# Patient Record
Sex: Male | Born: 1965 | Race: Black or African American | Hispanic: No | Marital: Single | Smoking: Current every day smoker
Health system: Southern US, Community
[De-identification: ages and names within clinical notes are randomized; demographics above are authoritative.]

## PROBLEM LIST (undated history)

## (undated) DIAGNOSIS — J40 Bronchitis, not specified as acute or chronic: Secondary | ICD-10-CM

## (undated) HISTORY — PX: EXTERNAL EAR SURGERY: SHX627

---

## 2014-10-16 ENCOUNTER — Emergency Department
Admission: EM | Admit: 2014-10-16 | Discharge: 2014-10-16 | Disposition: A | Discharge status: Home / Self Care | Payer: Self-pay | Attending: Student | Admitting: Student

## 2014-10-16 ENCOUNTER — Emergency Department: Payer: Self-pay

## 2014-10-16 ENCOUNTER — Encounter: Payer: Self-pay | Admitting: Emergency Medicine

## 2014-10-16 DIAGNOSIS — R079 Chest pain, unspecified: Secondary | ICD-10-CM

## 2014-10-16 DIAGNOSIS — Z720 Tobacco use: Secondary | ICD-10-CM | POA: Insufficient documentation

## 2014-10-16 DIAGNOSIS — M25511 Pain in right shoulder: Secondary | ICD-10-CM | POA: Insufficient documentation

## 2014-10-16 DIAGNOSIS — I872 Venous insufficiency (chronic) (peripheral): Secondary | ICD-10-CM | POA: Insufficient documentation

## 2014-10-16 DIAGNOSIS — Z86718 Personal history of other venous thrombosis and embolism: Secondary | ICD-10-CM | POA: Insufficient documentation

## 2014-10-16 DIAGNOSIS — M25512 Pain in left shoulder: Secondary | ICD-10-CM

## 2014-10-16 DIAGNOSIS — G8929 Other chronic pain: Secondary | ICD-10-CM | POA: Insufficient documentation

## 2014-10-16 LAB — TROPONIN I: Troponin I: <0.03 ng/mL (ref <0.031)

## 2014-10-16 LAB — CBC
HCT: 41.3 % (ref 40.0–52.0)
HEMOGLOBIN: 14.2 g/dL (ref 13.0–18.0)
MCH: 31 pg (ref 26.0–34.0)
MCHC: 34.4 g/dL (ref 32.0–36.0)
MCV: 90.2 fL (ref 80.0–100.0)
Platelets: 240 10*3/uL (ref 150–440)
RBC: 4.58 MIL/uL (ref 4.40–5.90)
RDW: 15 % — ABNORMAL HIGH (ref 11.5–14.5)
WBC: 6.8 10*3/uL (ref 3.8–10.6)

## 2014-10-16 LAB — BASIC METABOLIC PANEL
Anion gap: 9 (ref 5–15)
BUN: 18 mg/dL (ref 6–20)
CHLORIDE: 108 mmol/L (ref 101–111)
CO2: 24 mmol/L (ref 22–32)
Calcium: 8.8 mg/dL — ABNORMAL LOW (ref 8.9–10.3)
Creatinine, Ser: 1.14 mg/dL (ref 0.61–1.24)
GLUCOSE: 128 mg/dL — AB (ref 65–99)
Potassium: 3.6 mmol/L (ref 3.5–5.1)
SODIUM: 141 mmol/L (ref 135–145)

## 2014-10-16 LAB — FIBRIN DERIVATIVES D-DIMER (ARMC ONLY): FIBRIN DERIVATIVES D-DIMER (ARMC): 396 (ref 0–499)

## 2014-10-16 MED ORDER — IBUPROFEN 600 MG PO TABS
600.0000 mg | ORAL_TABLET | Freq: Once | ORAL | Status: AC
  Administered 2014-10-16: 600 mg via ORAL

## 2014-10-16 MED ORDER — IBUPROFEN 600 MG PO TABS
ORAL_TABLET | ORAL | Status: AC
  Administered 2014-10-16: 600 mg via ORAL
  Filled 2014-10-16: qty 1

## 2014-10-16 MED ORDER — IBUPROFEN 600 MG PO TABS: 600.0000 mg | ORAL_TABLET | Freq: Four times a day (QID) | ORAL | Status: DC | PRN

## 2014-10-16 NOTE — Discharge Instructions (Signed)
Return immediately to the emergency department if you develop severe/worsening/recurrent chest pain, if the pain changes in quality, if you have trouble breathing, if you have fainting or near fainting, fevers, recurrent vomiting, abdominal pain, numbness or weakness, or for any other concerns.
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
## 2014-10-16 NOTE — Care Management Note (Signed)
Case Management Note  Patient Details  Name: Curtis Chapman MRN: [id number] Date of Birth: 10/14/1965  Subjective/Objective:     SPoke to pt. Per Dr Inocencio HomesGayle. Pt. States [facility] is [facility], when I suggested he apply. He has a [place] address on his ID , and says [place] actually [place] in [place]. I encouraged him to get a current address ID. I also told him about [facility], since he also states he has no transport to get to his MD appts.               Action/Plan:   Expected Discharge Date:                  Expected Discharge Plan:     In-House Referral:     Discharge planning Services     Post Acute Care Choice:    Choice offered to:     DME Arranged:    DME Agency:     HH Arranged:    HH Agency:     Status of Service:     Medicare Important Message Given:    Date Medicare IM Given:    Medicare IM give by:    Date Additional Medicare IM Given:    Additional Medicare Important Message give by:     If discussed at Long Length of Stay Meetings, dates discussed:    Additional Comments:  Berna BueCheryl Brinlyn Cena, RN 10/16/2014, 9:25 AM
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
## 2014-10-16 NOTE — ED Notes (Signed)
esignature page not working, unable to obtain signature. Patient dcd with understanding and knowledge of instructions provided. Patient given prescriptions and dc packet.

## 2014-10-21 ENCOUNTER — Emergency Department
Admission: EM | Admit: 2014-10-21 | Discharge: 2014-10-21 | Disposition: A | Discharge status: Home / Self Care | Payer: Self-pay | Attending: Emergency Medicine | Admitting: Emergency Medicine

## 2014-10-21 ENCOUNTER — Encounter: Payer: Self-pay | Admitting: Emergency Medicine

## 2014-10-21 DIAGNOSIS — Z720 Tobacco use: Secondary | ICD-10-CM | POA: Insufficient documentation

## 2014-10-21 DIAGNOSIS — I872 Venous insufficiency (chronic) (peripheral): Secondary | ICD-10-CM

## 2014-10-21 DIAGNOSIS — I8312 Varicose veins of left lower extremity with inflammation: Secondary | ICD-10-CM | POA: Insufficient documentation

## 2014-10-21 DIAGNOSIS — I8311 Varicose veins of right lower extremity with inflammation: Secondary | ICD-10-CM | POA: Insufficient documentation

## 2014-10-21 MED ORDER — HYDROCORTISONE 1 % EX CREA
TOPICAL_CREAM | CUTANEOUS | Status: AC
Start: 2014-10-21 — End: 2015-10-21

## 2014-10-21 NOTE — Care Management Note (Signed)
Case Management Note  Patient Details  Name: Annye RuskDerrick Fambro MRN: [id number] Date of Birth: 1966-04-21  Subjective/Objective:   Pt. Here with itching in his legs. Given script by Dr Cyril LoosenKinner. After our talk last week, the pt. Has obtained a ID card with [place] on it. I have given the pt. The application form for [facility] across the street, as well as directions. He has his scooter , and willing to go there to get the medicine.                 Action/Plan:   Expected Discharge Date:                  Expected Discharge Plan:     In-House Referral:     Discharge planning Services     Post Acute Care Choice:    Choice offered to:     DME Arranged:    DME Agency:     HH Arranged:    HH Agency:     Status of Service:     Medicare Important Message Given:    Date Medicare IM Given:    Medicare IM give by:    Date Additional Medicare IM Given:    Additional Medicare Important Message give by:     If discussed at Long Length of Stay Meetings, dates discussed:    Additional Comments:  Berna BueCheryl Braden Deloach, RN 10/21/2014, 9:34 AM

## 2014-10-21 NOTE — Discharge Instructions (Signed)
Stasis Dermatitis Stasis dermatitis occurs when veins lose the ability to pump blood back to the heart (poor venous circulation). It causes a reddish-purple to brownish scaly, itchy rash on the legs. The rash comes from pooling of blood (stasis). CAUSES  This occurs because the veins do not work very well anymore or because pressure may be increased in the veins due to other conditions. With blood pooling, the increased pressure in the tiny blood vessels (capillaries) causes fluid to leak out of the capillaries into the tissue. The extra fluid makes it harder for the blood to feed the cells and get rid of waste products. SYMPTOMS  Stasis dermatitis appears as red, scaly, itchy patches on the legs. A yellowish or light brown discoloration is also present. Due to scratching or other injury, these patches can become an ulcer. This ulcer may remain for long periods of time. The ulcer can also become infected. Swelling of the legs is often present with stasis dermatitis. If the leg is swollen, this increases the risk of infection and further damage to the skin. Sometimes, intense itching, tingling, and burning occurs before signs of stasis dermatitis appear. You may find yourself scratching the insides of your ankles or rubbing your ankles together before the rash appears. After healing, there are often brown spots on the affected skin. DIAGNOSIS  Your caregiver makes this diagnosis based on an exam. Other tests may be done to better understand the cause. TREATMENT  If underlying conditions are present, they must be treated. Some of these conditions are heart failure, thyroid problems, poor nutrition, and varicose veins.  Cortisone creams and ointments applied to the skin (topically) may be needed, as well as medicine to reduce swelling in the legs (diuretics).  Compression stockings or an elastic wrap may also be needed to reduce swelling.  If there is an infection, antibiotic medicines may also be  used. HOME CARE INSTRUCTIONS   Try to rest and raise (elevate) the affected leg above the level of the heart, if possible.  Burow's solution wet packs applied for 30 minutes, 3 times daily, will help the weepy rash. Stop using the packs before your skin gets too dry. You can also use a mixture of 3 parts white vinegar to 1 quart water.  Grease your legs daily with ointments, such as petroleum jelly, to fight dryness.  Avoid scratching or injuring the area. SEEK IMMEDIATE MEDICAL CARE IF:   Your rash gets worse.  An ulcer forms.  You have an oral temperature above 102 F (38.9 C), not controlled by medicine.  You have any other severe symptoms. Document Released: 08/12/2005 Document Revised: 07/26/2011 Document Reviewed: 09/22/2009 [employer] Patient Information 2015 [employer]. This information is not intended to replace advice given to you by your health care provider. Make sure you discuss any questions you have with your health care provider.  

## 2014-10-21 NOTE — ED Notes (Signed)
x4 months

## 2014-10-21 NOTE — ED Provider Notes (Addendum)
[facility] Emergency Department Provider Note  ____________________________________________  Time seen: 9:05 AM  I have reviewed the triage vital signs and the nursing notes.   HISTORY  Chief Complaint Leg Pain    HPI Curtis Chapman is a 49 y.o. male who presents with complaints of itching in both legs which has gotten worse over the last few days. He has a history of chronic venous stasis of the legs and he is complaining of skin irritation and pruritus. He has no calf pain. No shortness of breath or pleurisy. He complains of skin color changes that has been gradual over the last 6 months in both legs. He requests creams to help him with the itching     History reviewed. No pertinent past medical history.  There are no active problems to display for this patient.   No past surgical history on file.  Current Outpatient Rx  Name  Route  Sig  Dispense  Refill  . ibuprofen (ADVIL,MOTRIN) 600 MG tablet   Oral   Take 1 tablet (600 mg total) by mouth every 6 (six) hours as needed for moderate pain.   15 tablet   0     Allergies Review of patient's allergies indicates no known allergies.  No family history on file.  Social History History  Substance Use Topics  . Smoking status: Current Every Day Smoker  . Smokeless tobacco: Not on file  . Alcohol Use: No    Review of Systems  Constitutional: Negative for fever. Eyes: Negative for visual changes. ENT: Negative for sore throat Cardiovascular: Negative for chest pain. Respiratory: Negative for shortness of breath. Gastrointestinal: Negative for abdominal pain, vomiting and diarrhea. Genitourinary: Negative for dysuria. Musculoskeletal: Negative for back pain. Skin: Positive for skin discoloration the lower legs    10-point ROS otherwise negative.  ____________________________________________   PHYSICAL EXAM:  VITAL SIGNS: ED Triage Vitals  Enc Vitals Group     BP 10/21/14 0830  113/68 mmHg     Pulse Rate 10/21/14 0830 82     Resp 10/21/14 0830 20     Temp 10/21/14 0830 98 F (36.7 C)     Temp Source 10/21/14 0830 Oral     SpO2 10/21/14 0830 96 %     Weight 10/21/14 0830 220 lb (99.791 kg)     Height 10/21/14 0830 5\' 6"  (1.676 m)     Head Cir --      Peak Flow --      Pain Score 10/21/14 0831 9     Pain Loc --      Pain Edu? --      Excl. in GC? --      Constitutional: Alert and oriented. Well appearing and in no distress. Eyes: Conjunctivae are normal. PERRL. ENT   Head: Normocephalic and atraumatic.   Nose: No rhinnorhea.   Mouth/Throat: Mucous membranes are moist. Cardiovascular: Normal rate, regular rhythm. Normal and symmetric distal pulses are present in all extremities. No murmurs, rubs, or gallops. Respiratory: Normal respiratory effort without tachypnea nor retractions. Breath sounds are clear and equal bilaterally.  Gastrointestinal: Soft and non-tender in all quadrants. No distention. There is no CVA tenderness. Genitourinary: deferred Musculoskeletal: Nontender with normal range of motion in all extremities. No lower extremity tenderness nor edema. Neurologic:  Normal speech and language. No gross focal neurologic deficits are appreciated. Skin:  Patient with reddish-brown discoloration along the medial aspect of the right leg and the left leg consistent with venous stasis. No ulceration  at this time. No calf tenderness to palpation Psychiatric: Mood and affect are normal. Patient exhibits appropriate insight and judgment.  ____________________________________________    LABS (pertinent positives/negatives)  Labs Reviewed - No data to display  ____________________________________________   EKG  None  ____________________________________________    RADIOLOGY  None  ____________________________________________   PROCEDURES  Procedure(s) performed: none  Critical Care performed:  none  ____________________________________________   INITIAL IMPRESSION / ASSESSMENT AND PLAN / ED COURSE  Pertinent labs & imaging results that were available during my care of the patient were reviewed by me and considered in my medical decision making (see chart for details).  Patient with chronic venous stasis. I will prescribe steroid cream for the dermatitis and provided education on conservative management with venous stasis.  I counseled the patient on smoking cessation for approximately 4 minutes.  ____________________________________________   FINAL CLINICAL IMPRESSION(S) / ED DIAGNOSES  Final diagnoses:  Chronic venous stasis dermatitis of both lower extremities     Jene Every, MD 10/21/14 [id number]  Jene Every, MD 10/21/14 [phone]

## 2014-11-09 ENCOUNTER — Encounter: Payer: Self-pay | Admitting: Emergency Medicine

## 2014-11-09 ENCOUNTER — Emergency Department: Payer: Self-pay

## 2014-11-09 ENCOUNTER — Emergency Department
Admission: EM | Admit: 2014-11-09 | Discharge: 2014-11-09 | Disposition: A | Discharge status: Home / Self Care | Payer: Self-pay | Attending: Emergency Medicine | Admitting: Emergency Medicine

## 2014-11-09 DIAGNOSIS — Z7952 Long term (current) use of systemic steroids: Secondary | ICD-10-CM | POA: Insufficient documentation

## 2014-11-09 DIAGNOSIS — I872 Venous insufficiency (chronic) (peripheral): Secondary | ICD-10-CM | POA: Insufficient documentation

## 2014-11-09 DIAGNOSIS — Z720 Tobacco use: Secondary | ICD-10-CM | POA: Insufficient documentation

## 2014-11-09 MED ORDER — SULFAMETHOXAZOLE-TRIMETHOPRIM 400-80 MG PO TABS: 1.0000 | ORAL_TABLET | Freq: Two times a day (BID) | ORAL | Status: DC

## 2014-11-09 NOTE — Discharge Instructions (Signed)
Continue home topical medication as needed for itching. AVOID scratching. Elevate legs. Use compression stockings daily. Take medication as prescribed. Keep legs clean.   Follow up your primary care physician or the above this week. Close follow up is important.   Return to ER for increased pain, swelling, redness, break in skin, new or worsening concerns.   Compression Stockings Compression stockings are elastic stockings that "compress" your legs. This helps to increase blood flow, decrease swelling, and reduces the chance of getting blood clots in your lower legs. Compression stockings are used:  After surgery.  If you have a history of poor circulation.  If you are prone to blood clots.  If you have varicose veins.  If you sit or are bedridden for long periods of time. WEARING COMPRESSION STOCKINGS  Your compression stockings should be worn as instructed by your caregiver.  Wearing the correct stocking size is important. Your caregiver can help measure and fit you to the correct size.  When wearing your stockings, do not allow the stockings to bunch up. This is especially important around your toes or behind your knees. Keep the stockings as smooth as possible.  Do not roll the stockings downward and leave them rolled down. This can form a restrictive band around your legs and can decrease blood flow.  The stockings should be removed once a day for 1 hour or as instructed by your caregiver. When the stockings are taken off, inspect your legs and feet. Look for:  Open sores.  Red spots.  Puffy areas (swelling).  Anything that does not seem normal. IMPORTANT INFORMATION ABOUT COMPRESSION STOCKINGS  The compression stockings should be clean, dry, and in good condition before you put them on.  Do not put lotion on your legs or feet. This makes it harder to put the stockings on.  Change your stockings immediately if they become wet or soiled.  Do not wear stockings that  are ripped or torn.  You may hand-wash or put your stockings in the washing machine. Use cold or warm water with mild detergent. Do not bleach your stockings. They may be air-dried or dried in the dryer on low heat.  If you have pain or have a feeling of "pins and needles" in your feet or legs, you may be wearing stockings that are too tight. Call your caregiver right away. SEEK IMMEDIATE MEDICAL CARE IF:   You have numbness or tingling in your lower legs that does not get better quickly after the stockings are removed.  Your toes or feet become cold and blue.  You develop open sores or have red spots on your legs that do not go away. MAKE SURE YOU:   Understand these instructions.  Will watch your condition.  Will get help right away if you are not doing well or get worse. Document Released: 02/28/2009 Document Revised: 07/26/2011 Document Reviewed: 02/28/2009 [facility] Patient Information 2015 [place], [place]. This information is not intended to replace advice given to you by your health care provider. Make sure you discuss any questions you have with your health care provider.  Stasis Dermatitis Stasis dermatitis occurs when veins lose the ability to pump blood back to the heart (poor venous circulation). Dry skin commonly occurs along with poor venous circulation. Stasis dermatitis appears as red, scaly, itchy patches on the legs. A yellowish or light brown discoloration is also present. Due to scratching or other injury, these patches can become an ulcer. This ulcer may remain for long periods of  time. The ulcer can also become infected. Swelling of the legs is often present with stasis dermatitis. If the leg is swollen, this increases the risk of infection and further damage to the skin. Sometimes, intense itching, tingling, and burning occurs before signs of stasis dermatitis appear. You may find yourself scratching the insides of your ankles or rubbing your ankles together before the  rash appears. After healing, there are often brown spots on the affected skin. Treatment includes resting and elevating the affected leg above the level of the heart, if possible. Cortisone creams and ointments applied to the skin (topically) may be needed, as well as medicine to reduce swelling in the legs (diuretics). Compression stockings or an elastic wrap may also be needed to reduce swelling. If there is an infection, antibiotic medicines may also be used. Your skin may react to topical medicine (sensitization). Your skin may react to ingredients in wool wax alcohols, fragrances, and even topical corticosteroids. Burow's solution wet packs applied for 30 minutes, 3 times daily, will help the weepy rash. Stop using the packs before your skin gets too dry. You can also use a mixture of 3 parts white vinegar to 1 quart water. Grease your legs daily with ointments, such as petroleum jelly, to fight dryness. Avoid scratching or injuring the affected area. Call your caregiver right away if your rash gets worse, if an ulcer forms, or if you have a fever or other severe symptoms. Document Released: 06/10/2004 Document Revised: 07/26/2011 Document Reviewed: 09/22/2009 [facility] Patient Information 2015 [place], [place]. This information is not intended to replace advice given to you by your health care provider. Make sure you discuss any questions you have with your health care provider.

## 2014-11-09 NOTE — ED Provider Notes (Signed)
[facility] Emergency Department Provider Note  ____________________________________________  Time seen: Approximately 10:22 AM  I have reviewed the triage vital signs and the nursing notes.   HISTORY  Chief Complaint Leg Pain   HPI Curtis Chapman is a 49 y.o. male presents to the ER for complaints of bilateral lower leg itching and intermittent discomfort. Patient states that he currently is working in [employer] and works approximate 6-7 days out of the week. Patient states that he has been seen recently for bilateral leg pain and diagnosed with chronic venous stasis. Patient states that for approximately one year he has been having intermittent swelling as well as a darker reddish discoloration to bilateral lower legs. Patient states that discoloration looks the same except states left leg slightly redder over last few days where he was scratching.   Patient states that his legs have been itching. States the itching has gotten worse over the last few days as it is hotter outside and he works outside. Patient states that he is here today as he needs a work note because he was unable to go to work today because his legs were itching. Patient states that he has hydrocortisone cream that he has been compliant to his legs which does help with the itching. States current leg pain is 3/10 and states pain is an aching pain. Denies pain radiation. States no pain when at rest. States pain "usually aches more after working all day."   Denies chest pain, shortness of breath, fever, abdominal pain, nausea, weakness, vomiting, change in appetite. Pt states he feels well except as states lower leg complaints.      No past medical history on file.  There are no active problems to display for this patient.   No past surgical history on file.  Current Outpatient Rx  Name  Route  Sig  Dispense  Refill  . hydrocortisone cream 1 %      Apply to affected area 2 times daily    30 g   1   . ibuprofen (ADVIL,MOTRIN) 600 MG tablet   Oral   Take 1 tablet (600 mg total) by mouth every 6 (six) hours as needed for moderate pain.   15 tablet   0   .         0     Allergies Review of patient's allergies indicates no known allergies.  No family history on file.  Social History History  Substance Use Topics  . Smoking status: Current Every Day Smoker  . Smokeless tobacco: Not on file  . Alcohol Use: No    Review of Systems Constitutional: No fever/chills Eyes: No visual changes. ENT: No sore throat. Cardiovascular: Denies chest pain. Respiratory: Denies shortness of breath. Gastrointestinal: No abdominal pain.  No nausea, no vomiting.  No diarrhea.  No constipation. Genitourinary: Negative for dysuria. Musculoskeletal: Negative for neck or back pain. Positive for bilateral lower leg itching and discomfort as above.  Skin: Negative for rash. Bilateral lower leg itching Neurological: Negative for headaches, focal weakness or numbness.  10-point ROS otherwise negative.  ____________________________________________   PHYSICAL EXAM:  VITAL SIGNS: ED Triage Vitals  Enc Vitals Group     BP 11/09/14 1005 119/81 mmHg     Pulse Rate 11/09/14 1005 85     Resp 11/09/14 1005 18     Temp 11/09/14 1005 98.2 F (36.8 C)     Temp Source 11/09/14 1005 Oral     SpO2 11/09/14 1005 97 %  Weight 11/09/14 1005 215 lb (97.523 kg)     Height 11/09/14 1005 '5\' 7"'  (1.702 m)     Head Cir --      Peak Flow --      Pain Score 11/09/14 1010 8     Pain Loc --      Pain Edu? --      Excl. in [place]? --     Constitutional: Alert and oriented. Well appearing and in no acute distress. Eyes: Conjunctivae are normal. PERRL. EOMI. Head: Atraumatic. Nose: No congestion/rhinnorhea. Mouth/Throat: Mucous membranes are moist.  Oropharynx non-erythematous. Neck: No stridor.  No cervical spine tenderness to palpation. Hematological/Lymphatic/Immunilogical: No cervical  lymphadenopathy. Cardiovascular: Normal rate, regular rhythm. Grossly normal heart sounds.  Good peripheral circulation. Normal and symmetric distal pulses present in all extremities.  Respiratory: Normal respiratory effort.  No retractions. Lungs CTAB. Gastrointestinal: Soft and nontender. No distention. No abdominal bruits. No CVA tenderness. normal bowel sounds Musculoskeletal: normal range of motion to bilateral upper and lower extremities. Mild TTP to bilateral lower legs generalized below knees. Brownish reddish venous stasis color change of bilateral lower extremities, per pt same as x 1 year. 2+ dorsalis pedis and posterior tibialis pulses bilaterally and easily found. Steady gait. Changes positions quickly without distress. Full ROM to lower extremities. Bilateral lower extremities with minimal swelling at ankles, nonpitting. Bilateral calfs nontender to palpation. Neurologic:  Normal speech and language. No gross focal neurologic deficits are appreciated. Speech is normal. No gait instability. Skin:  Skin is warm, dry and intact. No rash noted.except: right medial lower leg and left lateral and anterior lower leg with brownish-reddish coloration, skin intact. No ulceration. Left lateral leg with minimal to mild erythema. No drainage, fluctuance or induration.  Psychiatric: Mood and affect are normal. Speech and behavior are normal.  ____________________________________________   LABS (all labs ordered are listed, but only abnormal results are displayed) Labs reviewed from 10/16/14 Labs Reviewed - No data to display  Recent Results (from the past 2160 hour(s))  CBC     Status: Abnormal   Collection Time: 10/16/14  2:55 AM  Result Value Ref Range   WBC 6.8 3.8 - 10.6 K/uL   RBC 4.58 4.40 - 5.90 MIL/uL   Hemoglobin 14.2 13.0 - 18.0 g/dL   HCT 41.3 40.0 - 52.0 %   MCV 90.2 80.0 - 100.0 fL   MCH 31.0 26.0 - 34.0 pg   MCHC 34.4 32.0 - 36.0 g/dL   RDW 15.0 (H) 11.5 - 14.5 %   Platelets  240 150 - 440 K/uL  Basic metabolic panel     Status: Abnormal   Collection Time: 10/16/14  2:55 AM  Result Value Ref Range   Sodium 141 135 - 145 mmol/L   Potassium 3.6 3.5 - 5.1 mmol/L   Chloride 108 101 - 111 mmol/L   CO2 24 22 - 32 mmol/L   Glucose, Bld 128 (H) 65 - 99 mg/dL   BUN 18 6 - 20 mg/dL   Creatinine, Ser 1.14 0.61 - 1.24 mg/dL   Calcium 8.8 (L) 8.9 - 10.3 mg/dL   GFR calc non Af Amer >60 >60 mL/min   GFR calc Af Amer >60 >60 mL/min    Comment: (NOTE) The eGFR has been calculated using the CKD EPI equation. This calculation has not been validated in all clinical situations. eGFR's persistently <60 mL/min signify possible Chronic Kidney Disease.    Anion gap 9 5 - 15  Troponin I  Status: None   Collection Time: 10/16/14  2:55 AM  Result Value Ref Range   Troponin I <0.03 <0.031 ng/mL    Comment:        NO INDICATION OF MYOCARDIAL INJURY.   Fibrin derivatives D[facility])     Status: None   Collection Time: 10/16/14  7:37 AM  Result Value Ref Range   Fibrin derivatives D-dimer ([facility]) 396 0 - 499    Comment: <> Exclusion of Venous Thromboembolism (VTE) - OUTPATIENTS ONLY        (Emergency Department or [place])             0-499 ng/ml (FEU)  : With a low to intermediate pretest                                        probability for VTE this test result                                        excludes the diagnosis of VTE.           > 499 ng/ml (FEU)  : VTE not excluded.  Additional work up                                   for VTE is required.   <>  Testing on Inpatients and Evaluation of Disseminated Intravascular        Coagulation ([facility])             Reference Range:   0-499 ng/ml (FEU)    ____________________________________________  ________________________________________  RADIOLOGY BILATERAL LOWER EXTREMITY VENOUS DOPPLER ULTRASOUND  TECHNIQUE: Gray-scale sonography with graded compression, as well as color Doppler and duplex ultrasound were  performed to evaluate the lower extremity deep venous systems from the level of the common femoral vein and including the common femoral, femoral, profunda femoral, popliteal and calf veins including the posterior tibial, peroneal and gastrocnemius veins when visible. The superficial great saphenous vein was also interrogated. Spectral Doppler was utilized to evaluate flow at rest and with distal augmentation maneuvers in the common femoral, femoral and popliteal veins.  COMPARISON: None.  FINDINGS: RIGHT LOWER EXTREMITY  Common Femoral Vein: No evidence of thrombus. Normal compressibility, respiratory phasicity and response to augmentation.  Saphenofemoral Junction: No evidence of thrombus. Normal compressibility and flow on color Doppler imaging.  Profunda Femoral Vein: No evidence of thrombus. Normal compressibility and flow on color Doppler imaging.  Femoral Vein: No evidence of thrombus. Normal compressibility, respiratory phasicity and response to augmentation.  Popliteal Vein: No evidence of thrombus. Normal compressibility, respiratory phasicity and response to augmentation.  Calf Veins: No evidence of thrombus. Normal compressibility and flow on color Doppler imaging.  Superficial Great Saphenous Vein: No evidence of thrombus. Normal compressibility and flow on color Doppler imaging.  Venous Reflux: None.  Other Findings: None.  LEFT LOWER EXTREMITY  Common Femoral Vein: No evidence of thrombus. Normal compressibility, respiratory phasicity and response to augmentation.  Saphenofemoral Junction: No evidence of thrombus. Normal compressibility and flow on color Doppler imaging.  Profunda Femoral Vein: No evidence of thrombus. Normal compressibility and flow on color Doppler imaging.  Femoral Vein: No evidence of thrombus. Normal compressibility, respiratory phasicity and response to  augmentation.  Popliteal Vein: No evidence of thrombus. Normal  compressibility, respiratory phasicity and response to augmentation.  Calf Veins: No evidence of thrombus. Normal compressibility and flow on color Doppler imaging.  Superficial Great Saphenous Vein: No evidence of thrombus. Normal compressibility and flow on color Doppler imaging.  Venous Reflux: None.  Other Findings: None.  IMPRESSION: No evidence of deep venous thrombosis.   Electronically Signed By: Marin Olp M.D. On: 11/09/2014 11:18  ____________________________________________    ____________________________________________   INITIAL IMPRESSION / ASSESSMENT AND PLAN / ED COURSE  Pertinent labs & imaging results that were available during my care of the patient were reviewed by me and considered in my medical decision making (see chart for details).  Very well-appearing patient. No acute distress. Presents the ER for the complaint of itching bilateral lower legs as well as intermittent pain. Patient denies known fall or injury. Patient reports that he has had similar coloration and intermittent pain to both legs times one year. Patient states here today in the ER as he was unable to go to work and needed a work note.  Ultrasound showing NO evidence of deep venous thrombosis of bilateral lower extremities. Steady gait. Very well appearing. Patient with chronic venous stasis to bilateral lower legs. Discussed need with patient to follow up with his regular primary physician. TED hose also given applied to patient bilateral lower legs in ER.Skin is intact. No ulceration. Discussed need with patient for follow-up as well as elevating lower legs. Discussed and counseled to avoid scratching  frequently as to prevent secondary infection. Pt with minimal to mild erythema to left lower lateral leg and concern for early secondary infection. Will prescribe oral Bactrim.. Strict follow up and return parameters given. PT verbalized understanding and agreed to plan.     Counseled regarding smoking cessation. ____________________________________________   FINAL CLINICAL IMPRESSION(S) / ED DIAGNOSES  Final diagnoses:  Venous stasis dermatitis of both lower extremities  Skin infection left lower leg    Marylene Land, NP 11/09/14 1213  Orbie Pyo, MD 11/09/14 [phone]

## 2014-11-09 NOTE — ED Notes (Signed)
Pt c/o bilateral leg pain, states he was dx with varicose veins and venus stasis dermatitis, states he has noticed worsening pain, swelling and redness to bilateral lower ext.

## 2014-11-09 NOTE — ED Notes (Signed)
States he is having swelling to both lower ext. When standing. Has to stand for about 10 hrs a day. Min swelling noted with bruising to both legs

## 2014-11-20 ENCOUNTER — Emergency Department
Admission: EM | Admit: 2014-11-20 | Discharge: 2014-11-20 | Disposition: A | Discharge status: Home / Self Care | Payer: Self-pay | Attending: Emergency Medicine | Admitting: Emergency Medicine

## 2014-11-20 ENCOUNTER — Encounter: Payer: Self-pay | Admitting: *Deleted

## 2014-11-20 DIAGNOSIS — Z7952 Long term (current) use of systemic steroids: Secondary | ICD-10-CM | POA: Insufficient documentation

## 2014-11-20 DIAGNOSIS — L02231 Carbuncle of abdominal wall: Secondary | ICD-10-CM | POA: Insufficient documentation

## 2014-11-20 DIAGNOSIS — Z720 Tobacco use: Secondary | ICD-10-CM | POA: Insufficient documentation

## 2014-11-20 MED ORDER — IBUPROFEN 800 MG PO TABS
ORAL_TABLET | ORAL | Status: AC
  Administered 2014-11-20: 800 mg via ORAL
  Filled 2014-11-20: qty 1

## 2014-11-20 MED ORDER — IBUPROFEN 800 MG PO TABS
800.0000 mg | ORAL_TABLET | Freq: Once | ORAL | Status: AC
  Administered 2014-11-20: 800 mg via ORAL

## 2014-11-20 MED ORDER — SULFAMETHOXAZOLE-TRIMETHOPRIM 800-160 MG PO TABS
ORAL_TABLET | ORAL | Status: AC
Start: 2014-11-20 — End: 2014-11-20
  Administered 2014-11-20: 1 via ORAL
  Filled 2014-11-20: qty 1

## 2014-11-20 MED ORDER — SULFAMETHOXAZOLE-TRIMETHOPRIM 800-160 MG PO TABS
1.0000 | ORAL_TABLET | Freq: Once | ORAL | Status: AC
  Administered 2014-11-20: 1 via ORAL

## 2014-11-20 MED ORDER — SULFAMETHOXAZOLE-TRIMETHOPRIM 800-160 MG PO TABS: 1.0000 | ORAL_TABLET | Freq: Two times a day (BID) | ORAL | Status: DC

## 2014-11-20 NOTE — ED Provider Notes (Signed)
[facility] Emergency Department Provider Note ____________________________________________  Time seen: Approximately 3:18 AM  I have reviewed the triage vital signs and the nursing notes.   HISTORY  Chief Complaint Abscess  HPI Curtis Chapman is a 49 y.o. male who is complaining of a lesion or some type of ingrown hair or bite on the right side of his abdomen. Patient states that he started 2-3 days ago and it has had some drainage come out of it but it's gotten worse. Patient states that he lives in a homeless shelter and does not have resources to get medications. Patient denies any associated fever, chills, headache, dizziness, nausea, vomiting, diarrhea or any other symptoms. Patient states his pain on scale of 0-10 is a 2 or 3.  No past medical history on file.  There are no active problems to display for this patient.   No past surgical history on file.  Current Outpatient Rx  Name  Route  Sig  Dispense  Refill  . hydrocortisone cream 1 %      Apply to affected area 2 times daily   30 g   1   . ibuprofen (ADVIL,MOTRIN) 600 MG tablet   Oral   Take 1 tablet (600 mg total) by mouth every 6 (six) hours as needed for moderate pain.   15 tablet   0   . sulfamethoxazole-trimethoprim (BACTRIM DS,SEPTRA DS) 800-160 MG per tablet   Oral   Take 1 tablet by mouth 2 (two) times daily.   20 tablet   0     Allergies Review of patient's allergies indicates no known allergies.  No family history on file.  Social History History  Substance Use Topics  . Smoking status: Current Every Day Smoker  . Smokeless tobacco: Not on file  . Alcohol Use: No    Review of Systems Constitutional: No fever/chills Eyes: No visual changes. ENT: No sore throat. Cardiovascular: Denies chest pain. Respiratory: Denies shortness of breath. Gastrointestinal: No abdominal pain.  No nausea, no vomiting.  No diarrhea.  No constipation. Genitourinary: Negative for  dysuria. Musculoskeletal: Negative for back pain. Skin: Negative for rash. Lesion right side of the abdomen that has had some slight drainage and has continued to be painful. Neurological: Negative for headaches, focal weakness or numbness.  10-point ROS otherwise negative.  ____________________________________________   PHYSICAL EXAM:  VITAL SIGNS: ED Triage Vitals  Enc Vitals Group     BP 11/20/14 0138 100/58 mmHg     Pulse Rate 11/20/14 0138 92     Resp 11/20/14 0138 20     Temp 11/20/14 0138 98.4 F (36.9 C)     Temp Source 11/20/14 0138 Oral     SpO2 11/20/14 0138 97 %     Weight 11/20/14 0138 215 lb (97.523 kg)     Height 11/20/14 0138 5\' 7"  (1.702 m)     Head Cir --      Peak Flow --      Pain Score 11/20/14 0140 8     Pain Loc --      Pain Edu? --      Excl. in [place]? --     Constitutional: Alert and oriented. Well appearing and in no acute distress. Eyes: Conjunctivae are normal. PERRL. EOMI. Head: Atraumatic. Nose: No congestion/rhinnorhea. Mouth/Throat: Mucous membranes are moist.  Oropharynx non-erythematous. Neck: No stridor.   Cardiovascular: Normal rate, regular rhythm. Grossly normal heart sounds.  Good peripheral circulation. Respiratory: Normal respiratory effort.  No retractions. Lungs  CTAB. Gastrointestinal: Soft and nontender. No distention. No abdominal bruits. No CVA tenderness. Musculoskeletal: No lower extremity tenderness nor edema.  No joint effusions. Neurologic:  Normal speech and language. No gross focal neurologic deficits are appreciated. Speech is normal. No gait instability. Skin:  Skin is warm, dry and intact. No rash noted. Patient with 1 x 1.5 cm carbuncle with small amount of surrounding erythema to the right mid abdomen. There has been some previous drainage and there is no fluctuance at this time. Psychiatric: Mood and affect are normal. Speech and behavior are normal.  ____________________________________________   LABS (all labs  ordered are listed, but only abnormal results are displayed)  Labs Reviewed - No data to display ____________________________________________  EKG  None ____________________________________________  RADIOLOGY  None ____________________________________________   PROCEDURES  Procedure(s) performed: None  Critical Care performed: No  ____________________________________________   INITIAL IMPRESSION / ASSESSMENT AND PLAN / ED COURSE  Pertinent labs & imaging results that were available during my care of the patient were reviewed by me and considered in my medical decision making (see chart for details).  ----------------------------------------- 3:21 AM on 11/20/2014 -----------------------------------------  Patient is going to be started on Septra DS and he was encouraged to do warm compresses 3-4 times daily to make sure to keep the wound draining. If he gets any worse he is going to have to come back to have an I&D. Patient stated that he did not have resources to buy the antibiotics and he was told that he can come to the hospital with her medication assistance program and try to help him with antibiotics. Patient was given a dose prior to discharge. Patient was also given some ibuprofen for pain. ____________________________________________   FINAL CLINICAL IMPRESSION(S) / ED DIAGNOSES  Final diagnoses:  Carbuncle, abdominal wall      Leona Carry, MD 11/20/14 [phone]

## 2014-11-20 NOTE — ED Notes (Signed)
Pt has abscess on right side of abd for 2 days.  Pt has pain and drainage.  Area red and tender to touch.

## 2014-11-20 NOTE — ED Notes (Signed)
Pt has a small red raised area on the right side of the middle abdomen.

## 2015-05-14 ENCOUNTER — Encounter: Payer: Self-pay | Admitting: Emergency Medicine

## 2015-05-14 DIAGNOSIS — Z79899 Other long term (current) drug therapy: Secondary | ICD-10-CM | POA: Insufficient documentation

## 2015-05-14 DIAGNOSIS — F17200 Nicotine dependence, unspecified, uncomplicated: Secondary | ICD-10-CM | POA: Insufficient documentation

## 2015-05-14 DIAGNOSIS — N492 Inflammatory disorders of scrotum: Secondary | ICD-10-CM | POA: Insufficient documentation

## 2015-05-14 NOTE — ED Notes (Signed)
Patient ambulatory to triage with steady gait, without difficulty or distress noted; pt reports ?abscess to right groin x 4 days

## 2015-05-15 ENCOUNTER — Emergency Department
Admission: EM | Admit: 2015-05-15 | Discharge: 2015-05-15 | Disposition: A | Discharge status: Home / Self Care | Payer: Self-pay | Attending: Emergency Medicine | Admitting: Emergency Medicine

## 2015-05-15 DIAGNOSIS — N492 Inflammatory disorders of scrotum: Secondary | ICD-10-CM

## 2015-05-15 DIAGNOSIS — L0291 Cutaneous abscess, unspecified: Secondary | ICD-10-CM

## 2015-05-15 MED ORDER — CEPHALEXIN 500 MG PO CAPS
500.0000 mg | ORAL_CAPSULE | Freq: Once | ORAL | Status: AC
  Administered 2015-05-15: 500 mg via ORAL
  Filled 2015-05-15: qty 1

## 2015-05-15 MED ORDER — CEPHALEXIN 500 MG PO CAPS: 500.0000 mg | ORAL_CAPSULE | Freq: Two times a day (BID) | ORAL | Status: DC

## 2015-05-15 MED ORDER — OXYCODONE-ACETAMINOPHEN 5-325 MG PO TABS: 1.0000 | ORAL_TABLET | Freq: Once | ORAL | Status: DC

## 2015-05-15 MED ORDER — OXYCODONE-ACETAMINOPHEN 5-325 MG PO TABS: 1.0000 | ORAL_TABLET | ORAL | Status: DC | PRN

## 2015-05-15 MED ORDER — OXYCODONE-ACETAMINOPHEN 5-325 MG PO TABS
1.0000 | ORAL_TABLET | Freq: Once | ORAL | Status: AC
  Administered 2015-05-15: 1 via ORAL

## 2015-05-15 MED ORDER — OXYCODONE-ACETAMINOPHEN 5-325 MG PO TABS
ORAL_TABLET | ORAL | Status: AC
  Administered 2015-05-15: 1 via ORAL
  Filled 2015-05-15: qty 1

## 2015-05-15 NOTE — Discharge Instructions (Signed)

## 2015-05-15 NOTE — ED Notes (Signed)
Pt reports abscess to right testicle.  Abscess palpated, about 1" in diameter, yellow drainage noted.

## 2015-05-15 NOTE — ED Provider Notes (Signed)
[facility] Emergency Department Provider Note  ____________________________________________  Time seen: 1:30 AM  I have reviewed the triage vital signs and the nursing notes.   HISTORY  Chief Complaint Abscess   HPI Curtis Chapman is a 49 y.o. male presents with "abscess on his right scrotum 4 days that is actively draining pus. Patient denies any fever no nausea or vomiting. Patient denies any previous history of abscess    Past medical history None There are no active problems to display for this patient.   History reviewed. No pertinent past surgical history.  Current Outpatient Rx  Name  Route  Sig  Dispense  Refill  . cephALEXin (KEFLEX) 500 MG capsule   Oral   Take 1 capsule (500 mg total) by mouth 2 (two) times daily.   20 capsule   0   . hydrocortisone cream 1 %      Apply to affected area 2 times daily   30 g   1   . ibuprofen (ADVIL,MOTRIN) 600 MG tablet   Oral   Take 1 tablet (600 mg total) by mouth every 6 (six) hours as needed for moderate pain.   15 tablet   0   . oxyCODONE-acetaminophen (PERCOCET/ROXICET) 5-325 MG tablet   Oral   Take 1 tablet by mouth every 4 (four) hours as needed for severe pain.   15 tablet   0   . sulfamethoxazole-trimethoprim (BACTRIM DS,SEPTRA DS) 800-160 MG per tablet   Oral   Take 1 tablet by mouth 2 (two) times daily.   20 tablet   0     Allergies Review of patient's allergies indicates no known allergies.  No family history on file.  Social History Social History  Substance Use Topics  . Smoking status: Current Every Day Smoker  . Smokeless tobacco: None  . Alcohol Use: No    Review of Systems  Constitutional: Negative for fever. Eyes: Negative for visual changes. ENT: Negative for sore throat. Cardiovascular: Negative for chest pain. Respiratory: Negative for shortness of breath. Gastrointestinal: Negative for abdominal pain, vomiting and diarrhea. Genitourinary:  Negative for dysuria. Musculoskeletal: Negative for back pain. Skin: Negative for rash. Positive for right scrotal abscess Neurological: Negative for headaches, focal weakness or numbness.  10-point ROS otherwise negative.  ____________________________________________   PHYSICAL EXAM:  VITAL SIGNS: ED Triage Vitals  Enc Vitals Group     BP 05/14/15 2339 112/65 mmHg     Pulse Rate 05/14/15 2339 90     Resp 05/14/15 2339 18     Temp 05/14/15 2339 97.6 F (36.4 C)     Temp Source 05/14/15 2339 Oral     SpO2 05/14/15 2339 99 %     Weight 05/14/15 2339 190 lb (86.183 kg)     Height 05/14/15 2339  (1.702 m)     Head Cir --      Peak Flow --      Pain Score 05/14/15 2337 10     Pain Loc --      Pain Edu? --      Excl. in [place]? --      Constitutional: Alert and oriented. Well appearing and in no distress. Eyes: Conjunctivae are normal. PERRL. Normal extraocular movements. ENT   Head: Normocephalic and atraumatic.   Nose: No congestion/rhinnorhea.   Mouth/Throat: Mucous membranes are moist.   Neck: No stridor. Hematological/Lymphatic/Immunilogical: No cervical lymphadenopathy. Cardiovascular: Normal rate, regular rhythm. Normal and symmetric distal pulses are present in all extremities. No  murmurs, rubs, or gallops. Respiratory: Normal respiratory effort without tachypnea nor retractions. Breath sounds are clear and equal bilaterally. No wheezes/rales/rhonchi. Gastrointestinal: Soft and nontender. No distention. There is no CVA tenderness. Genitourinary: deferred Musculoskeletal: Nontender with normal range of motion in all extremities. No joint effusions.  No lower extremity tenderness nor edema. Neurologic:  Normal speech and language. No gross focal neurologic deficits are appreciated. Speech is normal.  Skin:  Right scrotal ulcer with active purulent drainage. Psychiatric: Mood and affect are normal. Speech and behavior are normal. Patient exhibits  appropriate insight and judgment.    INITIAL IMPRESSION / ASSESSMENT AND PLAN / ED COURSE  Pertinent labs & imaging results that were available during my care of the patient were reviewed by me and considered in my medical decision making (see chart for details).  Approximate 3 cc of pus expressed from the right scrotal abscess. Patient given Keflex 500 mg in the ED will be prescribed the same for home  ____________________________________________   FINAL CLINICAL IMPRESSION(S) / ED DIAGNOSES  Final diagnoses:  Scrotal wall abscess  Abscess      Darci Currentandolph N Brown, MD 05/15/15 [phone]

## 2015-06-16 ENCOUNTER — Encounter (HOSPITAL_COMMUNITY): Payer: Self-pay | Admitting: Emergency Medicine

## 2015-06-16 ENCOUNTER — Emergency Department (HOSPITAL_COMMUNITY)
Admission: EM | Admit: 2015-06-16 | Discharge: 2015-06-17 | Disposition: A | Discharge status: Home / Self Care | Payer: Self-pay | Attending: Emergency Medicine | Admitting: Emergency Medicine

## 2015-06-16 DIAGNOSIS — L03116 Cellulitis of left lower limb: Secondary | ICD-10-CM | POA: Insufficient documentation

## 2015-06-16 DIAGNOSIS — F17200 Nicotine dependence, unspecified, uncomplicated: Secondary | ICD-10-CM | POA: Insufficient documentation

## 2015-06-16 DIAGNOSIS — L03119 Cellulitis of unspecified part of limb: Secondary | ICD-10-CM

## 2015-06-16 DIAGNOSIS — L03115 Cellulitis of right lower limb: Secondary | ICD-10-CM | POA: Insufficient documentation

## 2015-06-16 DIAGNOSIS — I878 Other specified disorders of veins: Secondary | ICD-10-CM | POA: Insufficient documentation

## 2015-06-16 DIAGNOSIS — Z792 Long term (current) use of antibiotics: Secondary | ICD-10-CM | POA: Insufficient documentation

## 2015-06-16 MED ORDER — CEPHALEXIN 500 MG PO CAPS: 500.0000 mg | ORAL_CAPSULE | Freq: Three times a day (TID) | ORAL | Status: DC

## 2015-06-16 MED ORDER — CEPHALEXIN 250 MG PO CAPS
500.0000 mg | ORAL_CAPSULE | Freq: Once | ORAL | Status: AC
  Administered 2015-06-17: 500 mg via ORAL
  Filled 2015-06-16: qty 2

## 2015-06-16 MED ORDER — IBUPROFEN 800 MG PO TABS
800.0000 mg | ORAL_TABLET | Freq: Once | ORAL | Status: AC
  Administered 2015-06-17: 800 mg via ORAL
  Filled 2015-06-16: qty 1

## 2015-06-16 MED ORDER — HYDROCODONE-ACETAMINOPHEN 5-325 MG PO TABS: 2.0000 | ORAL_TABLET | ORAL | Status: DC | PRN

## 2015-06-16 NOTE — ED Notes (Signed)
Pt. reports bilateral leg pain for 4 days , denies injury or fall / ambulatory .

## 2015-06-17 NOTE — Discharge Instructions (Signed)
Cellulitis Cellulitis is an infection of the skin and the tissue beneath it. The infected area is usually red and tender. Cellulitis occurs most often in the arms and lower legs.  CAUSES  Cellulitis is caused by bacteria that enter the skin through cracks or cuts in the skin. The most common types of bacteria that cause cellulitis are staphylococci and streptococci. SIGNS AND SYMPTOMS   Redness and warmth.  Swelling.  Tenderness or pain.  Fever. DIAGNOSIS  Your health care provider can usually determine what is wrong based on a physical exam. Blood tests may also be done. TREATMENT  Treatment usually involves taking an antibiotic medicine. HOME CARE INSTRUCTIONS   Take your antibiotic medicine as directed by your health care provider. Finish the antibiotic even if you start to feel better.  Keep the infected arm or leg elevated to reduce swelling.  Apply a warm cloth to the affected area up to 4 times per day to relieve pain.  Take medicines only as directed by your health care provider.  Keep all follow-up visits as directed by your health care provider. SEEK MEDICAL CARE IF:   You notice red streaks coming from the infected area.  Your red area gets larger or turns dark in color.  Your bone or joint underneath the infected area becomes painful after the skin has healed.  Your infection returns in the same area or another area.  You notice a swollen bump in the infected area.  You develop new symptoms.  You have a fever. SEEK IMMEDIATE MEDICAL CARE IF:   You feel very sleepy.  You develop vomiting or diarrhea.  You have a general ill feeling (malaise) with muscle aches and pains.   This information is not intended to replace advice given to you by your health care provider. Make sure you discuss any questions you have with your health care provider.   Document Released: 02/10/2005 Document Revised: 01/22/2015 Document Reviewed: 07/19/2011 Elsevier Interactive  Patient Education 2016 [employer].   Emergency Department Resource Guide 1) Find a Doctor and Pay Out of Pocket Although you won't have to find out who is covered by your insurance plan, it is a good idea to ask around and get recommendations. You will then need to call the office and see if the doctor you have chosen will accept you as a new patient and what types of options they offer for patients who are self-pay. Some doctors offer discounts or will set up payment plans for their patients who do not have insurance, but you will need to ask so you aren't surprised when you get to your appointment.  2) Contact Your Local Health Department Not all health departments have doctors that can see patients for sick visits, but many do, so it is worth a call to see if yours does. If you don't know where your local health department is, you can check in your phone book. The CDC also has a tool to help you locate your state's health department, and many state websites also have listings of all of their local health departments.  3) Find a Walk-in Clinic If your illness is not likely to be very severe or complicated, you may want to try a walk in clinic. These are popping up all over the country in pharmacies, drugstores, and shopping centers. They're usually staffed by nurse practitioners or physician assistants that have been trained to treat common illnesses and complaints. They're usually fairly quick and inexpensive. However, if you have  serious medical issues or chronic medical problems, these are probably not your best option.  No Primary Care Doctor: - Call Health Connect at  [phone] - they can help you locate a primary care doctor that  accepts your insurance, provides certain services, etc. - Physician Referral Service- [phone]  Chronic Pain Problems: Organization         Address  Phone   Notes  [facility]  [phone] Patients need to be referred by their  primary care doctor.   Medication Assistance: Organization         Address  Phone   Notes  [facility] [address] [place], [place] [address] [phone] --Must be a resident of [facility] -- Must have NO insurance coverage whatsoever (no Medicaid/ Medicare, etc.) -- The pt. MUST have a primary care doctor that directs their care regularly and follows them in the community   [facility]ist  [phone]   [employer]  [phone]    Agencies that provide inexpensive medical care: Organization         Address  Phone   Notes  [place]  [phone]   [facility]    [phone]   [facility] [place], [place] [address] [phone]   [place] [address] [place] [phone]   Planned Parenthood    [phone]   [facility]    [phone]   [place] and [place] [address], [place] Phone:  [phone], Fax:  [phone] Hours of Operation:  9 am - 6 pm, M-F.  Also accepts Medicaid/Medicare and self-pay.  [facility] for [place], [address], [place] Phone: [phone], Fax: [phone]. Hours of Operation:  8:30 am - 5:30 pm, M-F.  Also accepts Medicaid and self-pay.  [facility] [place] [address] [place] Phone: [phone]   [place], [place], [place] [phone], Ext. [phone] Mondays & Thursdays: 7-9 AM.  First 15 patients are seen on a first come, first serve basis.    [place] Providers:  Organization         Address  Phone   Notes  [facility] [address], [place] [phone] Also accepts self-pay patients.  [facility] [address], [place]  [phone]   [place], [address], [place] [phone]   [facility] [address] [place] [phone]   Lucianne Lei [address], [place]   [phone] Only accepts [place] Access [place] patients after they have their name applied to their card.   Self-Pay (no insurance) in [facility]:  Organization         Address  Phone   Notes  Sickle Cell Patients, [facility] [place] [phone]   [facility] [place] [phone]   [facility] [place]  [place], [place], [place] [phone]   [facility]/Dr. Osei-Bonsu  [address] [place] or [address], [place] [phone] Phone number for both [place] and [place] locations  is the same.  Urgent Medical and [facility] [address], [place] [phone]   [facility] [place] [address], [place] or [address] [phone]   [facility] [address] [place] [phone], phone; [phone], fax Sees patients 1st and 3rd Saturday of every month.  Must not qualify for public or private insurance (i.e. Medicaid, Medicare, [place] Health Choice, Veterans' Benefits)  Household income should be no more than 200% of the poverty level The clinic cannot treat you if you are pregnant or think you are pregnant  Sexually transmitted diseases are not treated at the clinic.    Dental Care: Organization         Address  Phone  Notes  [facility] [place] [phone] Accepts children up to age 67 who are enrolled in [place] or [place]; pregnant women with a Medicaid card; and children who have applied for Medicaid or [place] Health Choice, but were declined, whose parents can pay a reduced fee  at time of service.  [facility] Department of [facility]  [address], [place] [phone] Accepts children up to age 79 who are enrolled in [place] or [place]; pregnant women with a Medicaid card; and children who have applied for Medicaid or [place] Health Choice, but were declined, whose parents can pay a reduced fee at time of service.  [place] Adult Dental Access PROGRAM  [place] [phone] Patients are seen by appointment only. Walk-ins are not accepted. [place] will see patients 37 years of age and older. Monday - Tuesday (8am-5pm) Most Wednesdays (8:30-5pm) $30 per visit, cash only  [facility] Adult Dental Access PROGRAM  [address], [facility] [phone] Patients are seen by appointment only. Walk-ins are not accepted. [place] will see patients 82 years of age and older. One Wednesday Evening (Monthly: Volunteer Based).  $30 per visit, cash only  [place]  [phone] for adults; Children under age 11, call Graduate Pediatric Dentistry at [phone]. Children aged 81-14, please call [phone] to request a pediatric application.  Dental services are provided in all areas of dental care including fillings, crowns and bridges, complete and partial dentures, implants, gum treatment, root canals, and extractions. Preventive care is also provided. Treatment is provided to both adults and children. Patients are selected via a lottery and there is often a waiting list.   [facility] [address], [place]  [phone] [email or web address]   [facility] [address] [place], [place] [phone], Ext. [phone] Second and Fourth Thursday of each month, opens at 6:30 AM; Clinic ends at 9 AM.  Patients are seen on a first-come first-served basis, and a limited number are seen during each clinic.   [facility]  [address] [place]ard Danker [place], [place] [phone]   Eligibility Requirements You must have lived in [place], [place], or [place] counties for at least the last three months.   You cannot be eligible for state or federal sponsored [employer], including [employer], [place], or [employer].   You generally cannot be eligible for healthcare insurance through your employer.    How to apply: Eligibility screenings are held every Tuesday and Wednesday afternoon from 1:00 pm until 4:00 pm. You do not need an  appointment for the interview!  [facility] [address], [place], [place]   [place]  [place] Department  [place]  [phone]    Behavioral Health Resources in the Community: Intensive Outpatient Programs Organization         Address  Phone  Notes  [place]. [address], [place], [place] [phone]   [facility] Outpatient [address], [place], [place]   ADS: Alcohol & Drug Svcs [address] [place], [place]   [place] [address]  [place], [place] or [phone]   Substance Abuse Resources Organization         Address  Phone  Notes  Alcohol and Drug Services  [phone]   [place]  [phone]   [facility] [place]   Chinita Pester  [phone]   Residential & Outpatient Substance Abuse Program  [phone]   Psychological Services Organization         Address  Phone  Notes  [facility] [place]  [place]  [phone]   [place] [address] [place] or [phone]    Mobile Crisis Teams Organization         Address  Phone  Notes  [facility], Mobile Crisis Care Unit  [phone]   Assertive Psychotherapeutic  Services  [address] [place], [place]   [place] [address], [place] [phone]    Self-Help/Support Groups Organization         Address  Phone             Notes  [place]. of [place] - variety of support groups  [place] Call for more information  Narcotics Anonymous (NA), [facility] [address], [employer] [place]  2 meetings at this location   Special educational needs teacher         Address  Phone  Notes  [facility] Residential Treatment [place],    [place]  [phone]   [facility]  [address], [place] [address], [place], [place]   [place], [place] [phone] Admissions: 8am-3pm M-F  Incentives Substance [place] [address]    [place], [place] [phone]   [facility] [address] [place], [place], [place]   [facility] [address]  [place], [place]   [facility] - Intensive Outpatient [address], [place], [place]   [facility] [place].,  [place], [place] [phone] or [phone]   [facility]idential Treatment Services (RTS) [address], [place], [place] Accepts Medicaid  [facility] [place] [address]  [place] [phone] Substance Abuse/Addiction Treatment   [facility] Organization         Address  Phone  Notes  [facility]  [phone]   Domenic Schwab, PhD [address] [place], [place]   [phone] or [phone]   [place], [place] [phone]   [place] [address], [place], [place] [phone] Insurance/Medicaid/sponsorship through [employer] and Families [address] [place] [address]  [place], [place] [phone] Therapy/tele-psych/case  °[facility] [address]  ° [place], [place] [phone]    °Dr. Arfeen  [phone]   °[facility] [place]  [employer] [place] [facility] 1) [address], [place] °2) [address], [place] °3)  [address], [place] [phone] °[phone] ° °[phone]   °[place] Child Abuse Hotline [phone]) [phone] or ([phone] (After Hours)    ° ° ° °

## 2015-06-17 NOTE — ED Provider Notes (Signed)
CSN: [id number]     Arrival date & time 06/16/15  2242 History   First MD Initiated Contact with Patient 06/16/15 2319     Chief Complaint  Patient presents with  . Leg Pain      HPI  Vision presents valuation of bilateral leg pain. States that symptoms for 4 days periodically states that he has leg pain for many months. He has some discoloration of his lower legs and states is been present since "summer". He states he is on his feet all the time. However states he does not work. No fevers no chills. No history DVT PE.  History reviewed. No pertinent past medical history. History reviewed. No pertinent past surgical history. No family history on file. Social History  Substance Use Topics  . Smoking status: Current Every Day Smoker  . Smokeless tobacco: None  . Alcohol Use: No    Review of Systems  Constitutional: Negative for fever, chills, diaphoresis, appetite change and fatigue.  HENT: Negative for mouth sores, sore throat and trouble swallowing.   Eyes: Negative for visual disturbance.  Respiratory: Negative for cough, chest tightness, shortness of breath and wheezing.   Cardiovascular: Negative for chest pain.  Gastrointestinal: Negative for nausea, vomiting, abdominal pain, diarrhea and abdominal distention.  Endocrine: Negative for polydipsia, polyphagia and polyuria.  Genitourinary: Negative for dysuria, frequency and hematuria.  Musculoskeletal: Positive for myalgias. Negative for gait problem.  Skin: Negative for color change, pallor and rash.  Neurological: Negative for dizziness, syncope, light-headedness and headaches.  Hematological: Does not bruise/bleed easily.  Psychiatric/Behavioral: Negative for behavioral problems and confusion.      Allergies  Review of patient's allergies indicates no known allergies.  Home Medications   Prior to Admission medications   Medication Sig Start Date End Date Taking? Authorizing Provider  cephALEXin (KEFLEX) 500 MG  capsule Take 1 capsule (500 mg total) by mouth 3 (three) times daily. 06/16/15   Rolland Porter, MD  HYDROcodone-acetaminophen (NORCO/VICODIN) 5-325 MG tablet Take 2 tablets by mouth every 4 (four) hours as needed. 06/16/15   Rolland Porter, MD  hydrocortisone cream 1 % Apply to affected area 2 times daily 10/21/14 10/21/15  Jene Every, MD  ibuprofen (ADVIL,MOTRIN) 600 MG tablet Take 1 tablet (600 mg total) by mouth every 6 (six) hours as needed for moderate pain. 10/16/14   Gayla Doss, MD  oxyCODONE-acetaminophen (PERCOCET/ROXICET) 5-325 MG tablet Take 1 tablet by mouth every 4 (four) hours as needed for severe pain. 05/15/15   Darci Current, MD  sulfamethoxazole-trimethoprim (BACTRIM DS,SEPTRA DS) 800-160 MG per tablet Take 1 tablet by mouth 2 (two) times daily. 11/20/14   Leona Carry, MD   BP 112/72 mmHg  Pulse 81  Temp(Src) 98 F (36.7 C) (Oral)  Resp 14  Ht  (1.727 m)  Wt 225 lb (102.059 kg)  BMI 34.22 kg/m2  SpO2 96% Physical Exam  Constitutional: He is oriented to person, place, and time. He appears well-developed and well-nourished. No distress.  HENT:  Head: Normocephalic.  Eyes: Conjunctivae are normal. Pupils are equal, round, and reactive to light. No scleral icterus.  Neck: Normal range of motion. Neck supple. No thyromegaly present.  Cardiovascular: Normal rate and regular rhythm.  Exam reveals no gallop and no friction rub.   No murmur heard. Pulmonary/Chest: Effort normal and breath sounds normal. No respiratory distress. He has no wheezes. He has no rales.  Abdominal: Soft. Bowel sounds are normal. He exhibits no distension. There is no tenderness. There  is no rebound.  Musculoskeletal: Normal range of motion.       Legs: Neurological: He is alert and oriented to person, place, and time.  Skin: Skin is warm and dry. No rash noted.  Psychiatric: He has a normal mood and affect. His behavior is normal.    ED Course  Procedures (including critical care time) Labs  Review Labs Reviewed - No data to display  Imaging Review No results found. I have personally reviewed and evaluated these images and lab results as part of my medical decision-making.   EKG Interpretation None      MDM   Final diagnoses:  Cellulitis of lower extremity, unspecified laterality  Venous stasis    No asymmetry of the legs. No sign of DVT. No palpable cording into the upper leg. Has venous stasis insufficiency of the legs. Has erythema right greater than left consistent with cellulitis. Does not appear septic or toxic. Not tachycardic or hypoxemic. Plan is Keflex, Motrin here as he is driving home. Vicodin for pain. Elevate the legs. Recheck with any worsening. Stop smoking.    Rolland Porter, MD 06/17/15 [phone]

## 2016-05-30 ENCOUNTER — Encounter: Payer: Self-pay | Admitting: *Deleted

## 2016-05-30 ENCOUNTER — Emergency Department: Payer: Self-pay

## 2016-05-30 ENCOUNTER — Emergency Department
Admission: EM | Admit: 2016-05-30 | Discharge: 2016-05-30 | Disposition: A | Discharge status: Home / Self Care | Payer: Self-pay | Attending: Emergency Medicine | Admitting: Emergency Medicine

## 2016-05-30 DIAGNOSIS — J069 Acute upper respiratory infection, unspecified: Secondary | ICD-10-CM | POA: Insufficient documentation

## 2016-05-30 DIAGNOSIS — F17210 Nicotine dependence, cigarettes, uncomplicated: Secondary | ICD-10-CM | POA: Insufficient documentation

## 2016-05-30 DIAGNOSIS — Z79899 Other long term (current) drug therapy: Secondary | ICD-10-CM | POA: Insufficient documentation

## 2016-05-30 MED ORDER — BENZONATATE 100 MG PO CAPS: 100.0000 mg | ORAL_CAPSULE | Freq: Three times a day (TID) | ORAL | 0 refills | Status: DC | PRN

## 2016-05-30 MED ORDER — ONDANSETRON 4 MG PO TBDP
ORAL_TABLET | ORAL | Status: AC
  Filled 2016-05-30: qty 1

## 2016-05-30 MED ORDER — ONDANSETRON 4 MG PO TBDP
4.0000 mg | ORAL_TABLET | Freq: Once | ORAL | Status: AC | PRN
  Administered 2016-05-30: 4 mg via ORAL

## 2016-05-30 MED ORDER — IPRATROPIUM-ALBUTEROL 0.5-2.5 (3) MG/3ML IN SOLN
3.0000 mL | Freq: Once | RESPIRATORY_TRACT | Status: AC
  Administered 2016-05-30: 3 mL via RESPIRATORY_TRACT
  Filled 2016-05-30: qty 3

## 2016-05-30 MED ORDER — FLUTICASONE PROPIONATE 50 MCG/ACT NA SUSP: 2.0000 | Freq: Every day | NASAL | 0 refills | Status: AC

## 2016-05-30 NOTE — ED Triage Notes (Signed)
Pt presents to ED reporting increasing cough over the past two days. Pt reports non productive cough and denies congestion at this time. Nausea reported . Last episode of vomiting reported to be last night. No resp. Distress noted at this time.
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
## 2016-05-30 NOTE — Discharge Instructions (Signed)
Your exam and chest x-ray are basically normal at this time. Take the prescription meds as directed. Dose OTC Delsym or Robitussin-DM syrup for cough relief. Increase fluid intake and follow-up with your provider as needed.

## 2016-08-30 IMAGING — US US EXTREM LOW VENOUS BILAT
1 series · 13 of 24 positions shown · non-contrast
Comparison: None.

CLINICAL DATA: Bilateral lower extremity swelling and calf pain 2
years intermittently. Smoker.



[Series 1: us extrem low venous bilat · 0.08mm/px · 13 of 60 slices shown]
[im 1/60]
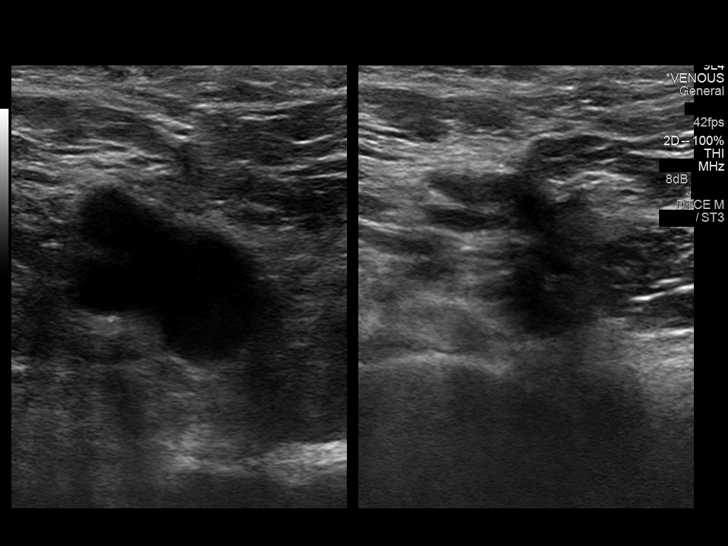
[im 6/60]
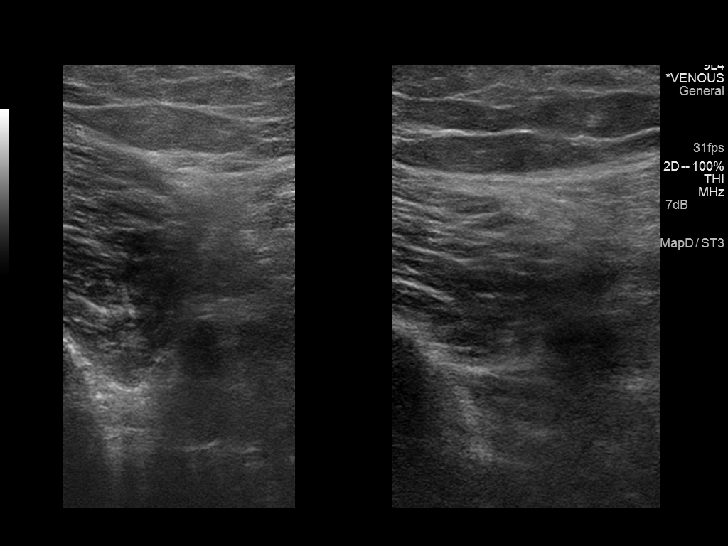
[im 11/60]
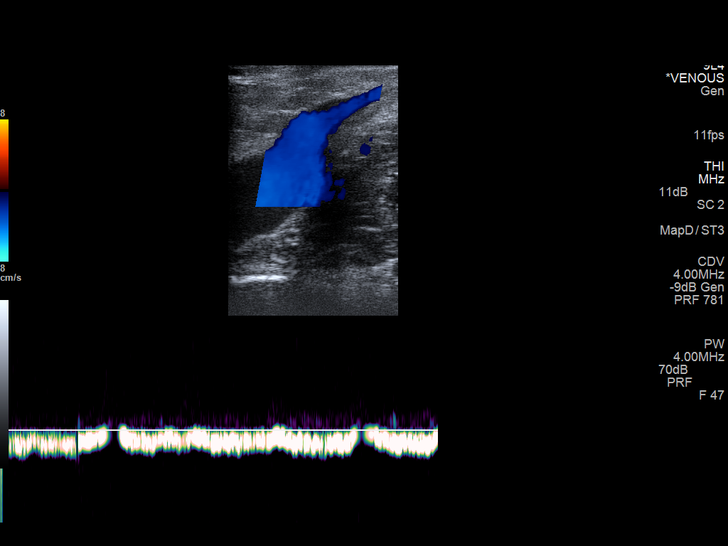
[im 16/60]
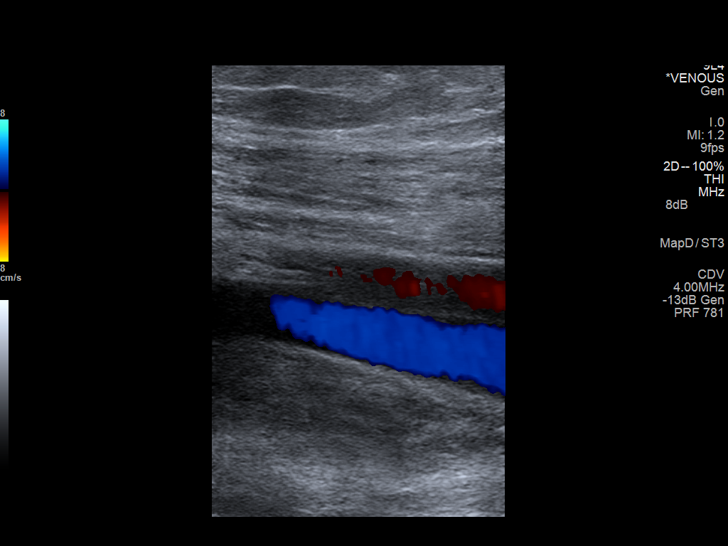
[im 21/60]
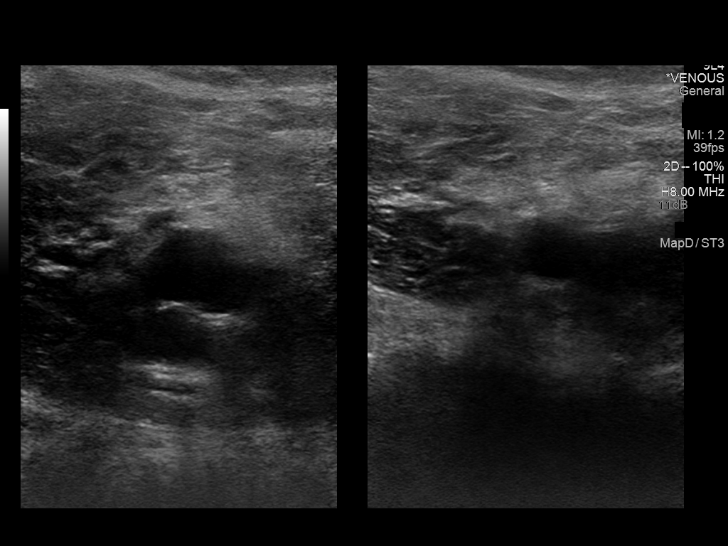
[im 26/60]
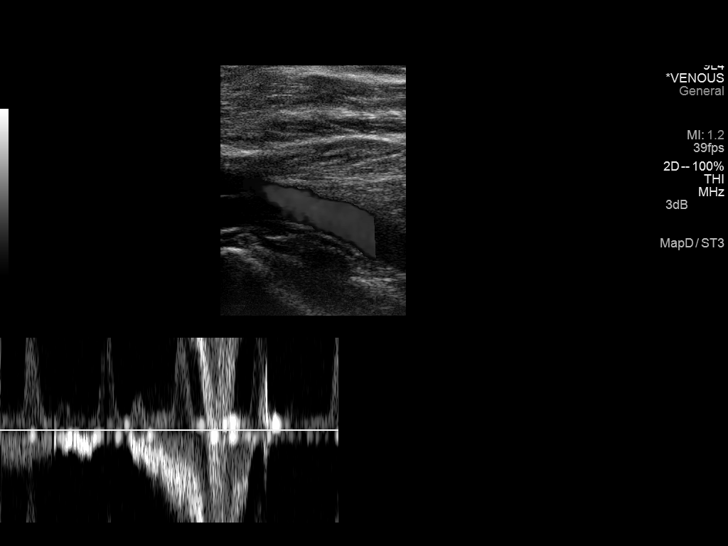
[im 31/60]
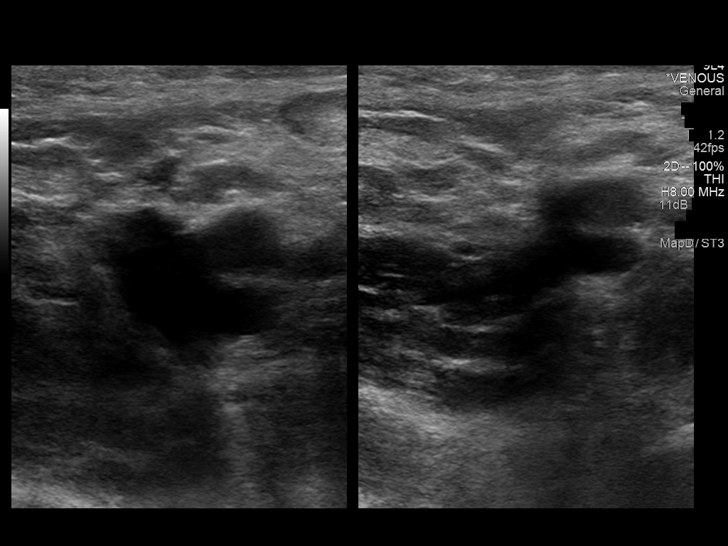
[im 34/60]
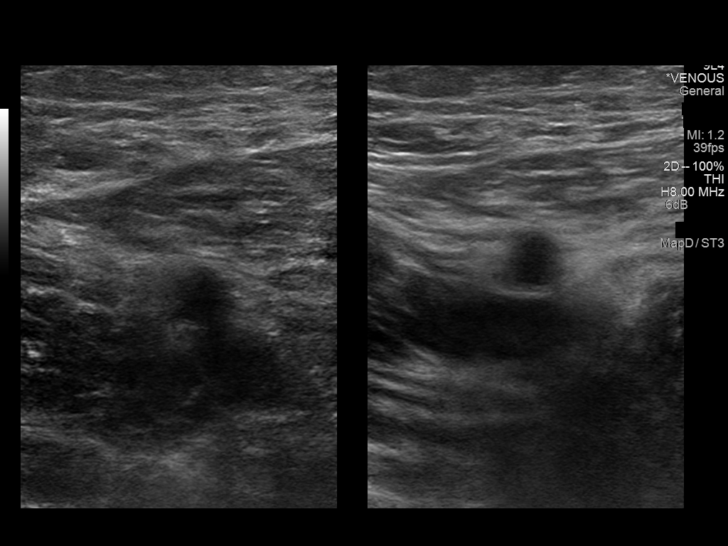
[im 39/60]
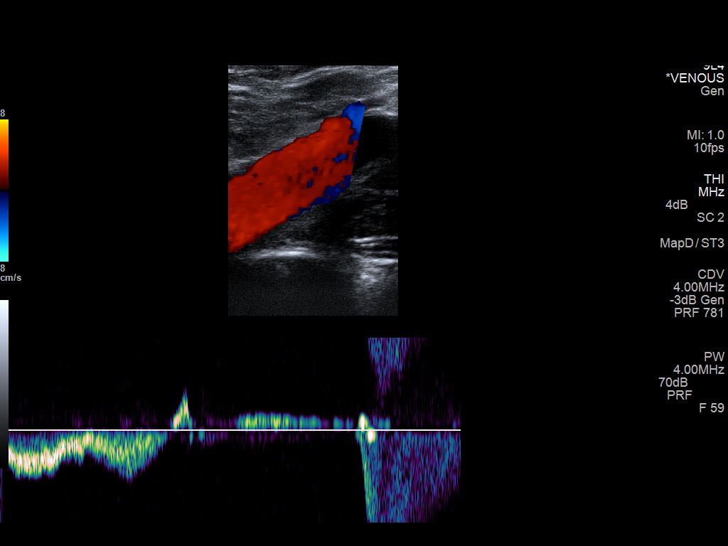
[im 44/60]
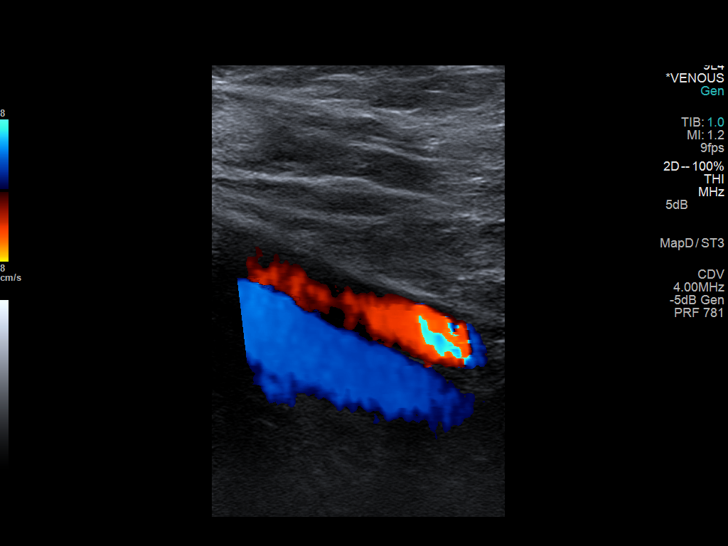
[im 49/60]
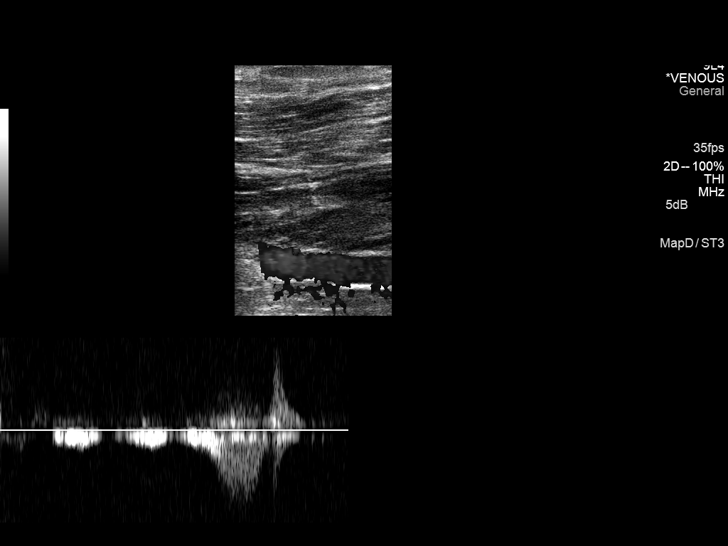
[im 54/60]
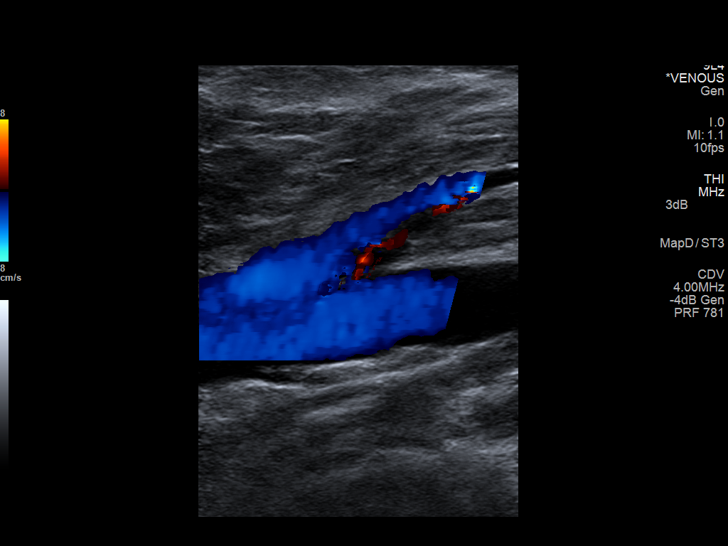
[im 60/60]
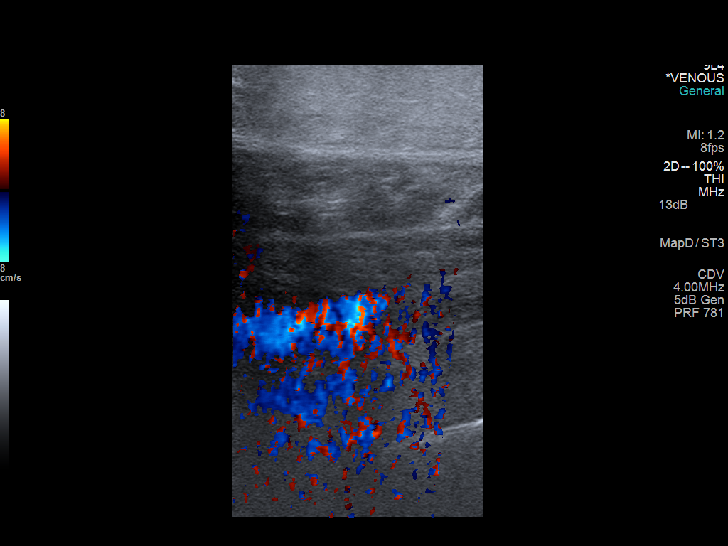

[13 of 24 positions shown; findings below may reference images not displayed]

FINDINGS: RIGHT LOWER EXTREMITY

Common Femoral Vein: No evidence of thrombus. Normal
compressibility, respiratory phasicity and response to augmentation.

Saphenofemoral Junction: No evidence of thrombus. Normal
compressibility and flow on color Doppler imaging.

Profunda Femoral Vein: No evidence of thrombus. Normal
compressibility and flow on color Doppler imaging.

Femoral Vein: No evidence of thrombus. Normal compressibility,
respiratory phasicity and response to augmentation.

Popliteal Vein: No evidence of thrombus. Normal compressibility,
respiratory phasicity and response to augmentation.

Calf Veins: No evidence of thrombus. Normal compressibility and flow
on color Doppler imaging.

Superficial Great Saphenous Vein: No evidence of thrombus. Normal
compressibility and flow on color Doppler imaging.

Venous Reflux:  None.

Other Findings:  None.

LEFT LOWER EXTREMITY

Common Femoral Vein: No evidence of thrombus. Normal
compressibility, respiratory phasicity and response to augmentation.

Saphenofemoral Junction: No evidence of thrombus. Normal
compressibility and flow on color Doppler imaging.

Profunda Femoral Vein: No evidence of thrombus. Normal
compressibility and flow on color Doppler imaging.

Femoral Vein: No evidence of thrombus. Normal compressibility,
respiratory phasicity and response to augmentation.

Popliteal Vein: No evidence of thrombus. Normal compressibility,
respiratory phasicity and response to augmentation.

Calf Veins: No evidence of thrombus. Normal compressibility and flow
on color Doppler imaging.

Superficial Great Saphenous Vein: No evidence of thrombus. Normal
compressibility and flow on color Doppler imaging.

Venous Reflux:  None.

Other Findings:  None.
IMPRESSION: No evidence of deep venous thrombosis.

## 2016-10-16 ENCOUNTER — Emergency Department
Admission: EM | Admit: 2016-10-16 | Discharge: 2016-10-17 | Disposition: A | Discharge status: Home / Self Care | Payer: Self-pay | Attending: Emergency Medicine | Admitting: Emergency Medicine

## 2016-10-16 DIAGNOSIS — H6121 Impacted cerumen, right ear: Secondary | ICD-10-CM | POA: Insufficient documentation

## 2016-10-16 DIAGNOSIS — F17210 Nicotine dependence, cigarettes, uncomplicated: Secondary | ICD-10-CM | POA: Insufficient documentation

## 2016-10-16 MED ORDER — ACETAMINOPHEN 325 MG PO TABS
650.0000 mg | ORAL_TABLET | Freq: Once | ORAL | Status: AC
  Administered 2016-10-16: 650 mg via ORAL

## 2016-10-16 MED ORDER — CARBAMIDE PEROXIDE 6.5 % OT SOLN
5.0000 [drp] | Freq: Two times a day (BID) | OTIC | Status: DC
  Administered 2016-10-16: 5 [drp] via OTIC
  Filled 2016-10-16: qty 15

## 2016-10-16 MED ORDER — ACETAMINOPHEN 325 MG PO TABS
ORAL_TABLET | ORAL | Status: AC
  Filled 2016-10-16: qty 2

## 2016-10-16 NOTE — ED Provider Notes (Signed)
[facility] Emergency Department Provider Note  ____________________________________________  Time seen: Approximately 11:19 PM  I have reviewed the triage vital signs and the nursing notes.   HISTORY  Chief Complaint Otalgia (right)    HPI Curtis Chapman is a 51 y.o. male that presents to emergency department unable to hear out of right ear for2 days. He has a lot of wax that he cannot get out. He denies fever, dental pain, shortness breath, chest pain, nausea, vomiting, abdominal pain.   History reviewed. No pertinent past medical history.  There are no active problems to display for this patient.   Past Surgical History:  Procedure Laterality Date  . EXTERNAL EAR SURGERY      Prior to Admission medications   Medication Sig Start Date End Date Taking? Authorizing Provider  benzonatate (TESSALON PERLES) 100 MG capsule Take 1 capsule (100 mg total) by mouth 3 (three) times daily as needed for cough (Take 1-2 per dose). 05/30/16   Menshew, Charlesetta IvoryJenise V Bacon, PA-C  cephALEXin (KEFLEX) 500 MG capsule Take 1 capsule (500 mg total) by mouth 3 (three) times daily. 06/16/15   Rolland PorterJames, Mark, MD  fluticasone (FLONASE) 50 MCG/ACT nasal spray Place 2 sprays into both nostrils daily. 05/30/16   Menshew, Charlesetta IvoryJenise V Bacon, PA-C  HYDROcodone-acetaminophen (NORCO/VICODIN) 5-325 MG tablet Take 2 tablets by mouth every 4 (four) hours as needed. 06/16/15   Rolland PorterJames, Mark, MD  ibuprofen (ADVIL,MOTRIN) 600 MG tablet Take 1 tablet (600 mg total) by mouth every 6 (six) hours as needed for moderate pain. 10/16/14   Gayla DossGayle, Eryka A, MD  oxyCODONE-acetaminophen (PERCOCET/ROXICET) 5-325 MG tablet Take 1 tablet by mouth every 4 (four) hours as needed for severe pain. 05/15/15   Darci CurrentBrown, [place] N, MD  sulfamethoxazole-trimethoprim (BACTRIM DS,SEPTRA DS) 800-160 MG per tablet Take 1 tablet by mouth 2 (two) times daily. 11/20/14   Leona Carryaylor, Linda M, MD    Allergies Patient has no known  allergies.  History reviewed. No pertinent family history.  Social History Social History  Substance Use Topics  . Smoking status: Current Every Day Smoker    Packs/day: 0.50    Types: Cigarettes  . Smokeless tobacco: Never Used  . Alcohol use No     Review of Systems  Cardiovascular: No chest pain. Respiratory: No SOB. Gastrointestinal: No abdominal pain.  No nausea, no vomiting.  Musculoskeletal: Negative for musculoskeletal pain. Skin: Negative for rash, abrasions, lacerations, ecchymosis. Neurological: Negative for headaches, numbness or tingling   ____________________________________________   PHYSICAL EXAM:  VITAL SIGNS: ED Triage Vitals  Enc Vitals Group     BP 10/16/16 2200 113/71     Pulse Rate 10/16/16 2200 95     Resp 10/16/16 2200 15     Temp 10/16/16 2200 98.3 F (36.8 C)     Temp Source 10/16/16 2200 Oral     SpO2 10/16/16 2200 96 %     Weight 10/16/16 2200 214 lb (97.1 kg)     Height 10/16/16 2200 5\' 8"  (1.727 m)     Head Circumference --      Peak Flow --      Pain Score 10/16/16 2159 9     Pain Loc --      Pain Edu? --      Excl. in GC? --      Constitutional: Alert and oriented. Well appearing and in no acute distress. Eyes: Conjunctivae are normal. PERRL. EOMI. Head:  ENT:      Ears: Right ear canal  is impacted with cerumen. Cerumen in left ear canal.       Nose: No congestion/rhinnorhea.      Mouth/Throat: Mucous membranes are moist.  Neck: No stridor.  Cardiovascular: Normal rate, regular rhythm.  Good peripheral circulation. Respiratory: Normal respiratory effort without tachypnea or retractions. Lungs CTAB. Good air entry to the bases with no decreased or absent breath sounds. Musculoskeletal: Full range of motion to all extremities. No gross deformities appreciated. Neurologic:  Normal speech and language. No gross focal neurologic deficits are appreciated.  Skin:  Skin is warm, dry and intact. No rash  noted.    ____________________________________________   LABS (all labs ordered are listed, but only abnormal results are displayed)  Labs Reviewed - No data to display ____________________________________________  EKG   ____________________________________________  RADIOLOGY   No results found.  ____________________________________________    PROCEDURES  Procedure(s) performed:    Procedures  Debrox was applied to right and left ears. Right ear was irrigated with normal saline. Cerumen was removed with ear curette.   Medications  acetaminophen (TYLENOL) tablet 650 mg (650 mg Oral Given 10/16/16 2348)     ____________________________________________   INITIAL IMPRESSION / ASSESSMENT AND PLAN / ED COURSE  Pertinent labs & imaging results that were available during my care of the patient were reviewed by me and considered in my medical decision making (see chart for details).  Review of the [place] CSRS was performed in accordance of the [facility] prior to dispensing any controlled drugs.   Patient's diagnosis is consistent with cerumen impaction.  Vital signs and exam are reassuring. Debrox was applied to both ears. Right ear was irrigated and cerumen was removed. Patient was able to hear after procedure. He will continue applying Debrox to both ears. Patient is to follow up with PCP as directed. Patient is given ED precautions to return to the ED for any worsening or new symptoms.     ____________________________________________  FINAL CLINICAL IMPRESSION(S) / ED DIAGNOSES  Final diagnoses:  Impacted cerumen of right ear      NEW MEDICATIONS STARTED DURING THIS VISIT:  Discharge Medication List as of 10/17/2016 12:20 AM          This chart was dictated using voice recognition software/Dragon. Despite best efforts to proofread, errors can occur which can change the meaning. Any change was purely unintentional.    Enid Derry, PA-C 10/17/16 1537     Governor Rooks, MD 10/23/16 [phone]

## 2016-10-16 NOTE — ED Triage Notes (Signed)
Patient c/o right ear pain X 2 days. Pt has hx of previous surgery to ear post dog bite.

## 2016-10-16 NOTE — ED Notes (Signed)
Patient with complaint of right ear pain times two days.

## 2017-11-22 ENCOUNTER — Emergency Department
Admission: EM | Admit: 2017-11-22 | Discharge: 2017-11-22 | Disposition: A | Discharge status: Home / Self Care | Payer: Self-pay | Attending: Emergency Medicine | Admitting: Emergency Medicine

## 2017-11-22 ENCOUNTER — Other Ambulatory Visit: Payer: Self-pay

## 2017-11-22 ENCOUNTER — Encounter: Payer: Self-pay | Admitting: Emergency Medicine

## 2017-11-22 DIAGNOSIS — K649 Unspecified hemorrhoids: Secondary | ICD-10-CM | POA: Insufficient documentation

## 2017-11-22 DIAGNOSIS — Z79899 Other long term (current) drug therapy: Secondary | ICD-10-CM | POA: Insufficient documentation

## 2017-11-22 DIAGNOSIS — F17210 Nicotine dependence, cigarettes, uncomplicated: Secondary | ICD-10-CM | POA: Insufficient documentation

## 2017-11-22 LAB — COMPREHENSIVE METABOLIC PANEL
ALK PHOS: 37 U/L — AB (ref 38–126)
ALT: 23 U/L (ref 0–44)
AST: 22 U/L (ref 15–41)
Albumin: 3.4 g/dL — ABNORMAL LOW (ref 3.5–5.0)
Anion gap: 6 (ref 5–15)
BILIRUBIN TOTAL: 0.4 mg/dL (ref 0.3–1.2)
BUN: 20 mg/dL (ref 6–20)
CO2: 29 mmol/L (ref 22–32)
CREATININE: 1.23 mg/dL (ref 0.61–1.24)
Calcium: 8.8 mg/dL — ABNORMAL LOW (ref 8.9–10.3)
Chloride: 108 mmol/L (ref 98–111)
GFR calc Af Amer: >60 mL/min (ref >60)
Glucose, Bld: 108 mg/dL — ABNORMAL HIGH (ref 70–99)
Potassium: 3.7 mmol/L (ref 3.5–5.1)
Sodium: 143 mmol/L (ref 135–145)
TOTAL PROTEIN: 6.4 g/dL — AB (ref 6.5–8.1)

## 2017-11-22 LAB — TYPE AND SCREEN
ABO/RH(D): O POS
ANTIBODY SCREEN: NEGATIVE

## 2017-11-22 LAB — CBC
HCT: 46.9 % (ref 40.0–52.0)
Hemoglobin: 16.1 g/dL (ref 13.0–18.0)
MCH: 32.4 pg (ref 26.0–34.0)
MCHC: 34.4 g/dL (ref 32.0–36.0)
MCV: 94.3 fL (ref 80.0–100.0)
PLATELETS: 214 10*3/uL (ref 150–440)
RBC: 4.97 MIL/uL (ref 4.40–5.90)
RDW: 15 % — AB (ref 11.5–14.5)
WBC: 7.5 10*3/uL (ref 3.8–10.6)

## 2017-11-22 NOTE — ED Notes (Signed)
Patient unable to provide urine sample at this time

## 2017-11-22 NOTE — ED Provider Notes (Signed)
[facility] Emergency Department Provider Note   ____________________________________________    I have reviewed the triage vital signs and the nursing notes.   HISTORY  Chief Complaint Rectal Bleeding     HPI Curtis Chapman is a 52 y.o. male who presents with complaints of rectal bleeding intermittently over the last month.  This is always associated with a bowel movement but sometimes he does not have any bleeding.  He denies abdominal pain.  No rectal pain.  States bright red blood with and after a bowel movement and on tissue paper.  No blood thinners.  No history of diverticular bleeding.   History reviewed. No pertinent past medical history.  There are no active problems to display for this patient.   Past Surgical History:  Procedure Laterality Date  . EXTERNAL EAR SURGERY      Prior to Admission medications   Medication Sig Start Date End Date Taking? Authorizing Provider  benzonatate (TESSALON PERLES) 100 MG capsule Take 1 capsule (100 mg total) by mouth 3 (three) times daily as needed for cough (Take 1-2 per dose). 05/30/16   Menshew, Charlesetta Ivory, PA-C  cephALEXin (KEFLEX) 500 MG capsule Take 1 capsule (500 mg total) by mouth 3 (three) times daily. 06/16/15   Rolland Porter, MD  fluticasone (FLONASE) 50 MCG/ACT nasal spray Place 2 sprays into both nostrils daily. 05/30/16   Menshew, Charlesetta Ivory, PA-C  HYDROcodone-acetaminophen (NORCO/VICODIN) 5-325 MG tablet Take 2 tablets by mouth every 4 (four) hours as needed. 06/16/15   Rolland Porter, MD  ibuprofen (ADVIL,MOTRIN) 600 MG tablet Take 1 tablet (600 mg total) by mouth every 6 (six) hours as needed for moderate pain. 10/16/14   Gayla Doss, MD  oxyCODONE-acetaminophen (PERCOCET/ROXICET) 5-325 MG tablet Take 1 tablet by mouth every 4 (four) hours as needed for severe pain. 05/15/15   Darci Current, MD  sulfamethoxazole-trimethoprim (BACTRIM DS,SEPTRA DS) 800-160 MG per tablet Take 1 tablet  by mouth 2 (two) times daily. 11/20/14   Leona Carry, MD     Allergies Patient has no known allergies.  No family history on file.  Social History Social History   Tobacco Use  . Smoking status: Current Every Day Smoker    Packs/day: 0.50    Types: Cigarettes  . Smokeless tobacco: Never Used  Substance Use Topics  . Alcohol use: No  . Drug use: No    Review of Systems  Constitutional: No weakness, no fevers Eyes: No visual changes.  ENT: No sore throat. Cardiovascular: Denies chest pain. Respiratory: Denies shortness of breath. Gastrointestinal: As above.   Genitourinary: Negative for dysuria. Musculoskeletal: Negative for back pain. Skin: Negative for rash. Neurological: Negative for headaches   ____________________________________________   PHYSICAL EXAM:  VITAL SIGNS: ED Triage Vitals  Enc Vitals Group     BP 11/22/17 0553 121/74     Pulse Rate 11/22/17 0553 68     Resp 11/22/17 0553 18     Temp 11/22/17 0553 97.8 F (36.6 C)     Temp Source 11/22/17 0553 Oral     SpO2 11/22/17 0553 99 %     Weight 11/22/17 0550 99.8 kg (220 lb)     Height 11/22/17 0550 1.702 m (5\' 7" )     Head Circumference --      Peak Flow --      Pain Score 11/22/17 0550 7     Pain Loc --      Pain Edu? --  Excl. in GC? --     Constitutional: Alert and oriented. No acute distress. Pleasant and interactive Eyes: Conjunctivae are normal.   Nose: No congestion/rhinnorhea. Mouth/Throat: Mucous membranes are moist.    Cardiovascular: Normal rate, regular rhythm.   Good peripheral circulation. Respiratory: Normal respiratory effort.  No retractions. Gastrointestinal: Soft and nontender. No distention.  2 external hemorrhoids, 11 and 7 o'clock position, not currently bleeding  Musculoskeletal:  Warm and well perfused Neurologic:  Normal speech and language. No gross focal neurologic deficits are appreciated.  Skin:  Skin is warm, dry and intact. No rash  noted. Psychiatric: Mood and affect are normal. Speech and behavior are normal.  ____________________________________________   LABS (all labs ordered are listed, but only abnormal results are displayed)  Labs Reviewed  COMPREHENSIVE METABOLIC PANEL - Abnormal; Notable for the following components:      Result Value   Glucose, Bld 108 (*)    Calcium 8.8 (*)    Total Protein 6.4 (*)    Albumin 3.4 (*)    Alkaline Phosphatase 37 (*)    All other components within normal limits  CBC - Abnormal; Notable for the following components:   RDW 15.0 (*)    All other components within normal limits  URINALYSIS, COMPLETE (UACMP) WITH MICROSCOPIC  TYPE AND SCREEN   ____________________________________________  EKG  None ____________________________________________  RADIOLOGY  None ____________________________________________   PROCEDURES  Procedure(s) performed: No  Procedures   Critical Care performed: No ____________________________________________   INITIAL IMPRESSION / ASSESSMENT AND PLAN / ED COURSE  Pertinent labs & imaging results that were available during my care of the patient were reviewed by me and considered in my medical decision making (see chart for details).  Patient well-appearing in no acute distress.  Hemoglobin is normal.  HPI is most consistent with hemorrhoids and indeed on exam he does have 2 hemorrhoids, not currently bleeding.  Discussed proper stooling behavior, treatment of hemorrhoids.  Outpatient referral if no significant improvement over the next several weeks    ____________________________________________   FINAL CLINICAL IMPRESSION(S) / ED DIAGNOSES  Final diagnoses:  Hemorrhoids, unspecified hemorrhoid type        Note:  This document was prepared using Dragon voice recognition software and may include unintentional dictation errors.    Jene EveryKinner, Jeno Calleros, MD 11/22/17 [phone]

## 2017-11-22 NOTE — ED Triage Notes (Signed)
Patient ambulatory to triage with steady gait, without difficulty or distress noted; pt reports lower abd pain and rectal bleeding x month

## 2017-12-10 ENCOUNTER — Other Ambulatory Visit: Payer: Self-pay

## 2017-12-10 ENCOUNTER — Emergency Department
Admission: EM | Admit: 2017-12-10 | Discharge: 2017-12-10 | Disposition: A | Discharge status: Home / Self Care | Payer: Self-pay | Attending: Emergency Medicine | Admitting: Emergency Medicine

## 2017-12-10 ENCOUNTER — Encounter: Payer: Self-pay | Admitting: Emergency Medicine

## 2017-12-10 ENCOUNTER — Emergency Department: Payer: Self-pay

## 2017-12-10 DIAGNOSIS — L03116 Cellulitis of left lower limb: Secondary | ICD-10-CM | POA: Insufficient documentation

## 2017-12-10 DIAGNOSIS — M79605 Pain in left leg: Secondary | ICD-10-CM

## 2017-12-10 DIAGNOSIS — F17210 Nicotine dependence, cigarettes, uncomplicated: Secondary | ICD-10-CM | POA: Insufficient documentation

## 2017-12-10 DIAGNOSIS — M79662 Pain in left lower leg: Secondary | ICD-10-CM | POA: Insufficient documentation

## 2017-12-10 MED ORDER — ACETAMINOPHEN 500 MG PO TABS
1000.0000 mg | ORAL_TABLET | Freq: Once | ORAL | Status: AC
  Administered 2017-12-10: 1000 mg via ORAL
  Filled 2017-12-10: qty 2

## 2017-12-10 MED ORDER — NAPROXEN 500 MG PO TABS
500.0000 mg | ORAL_TABLET | Freq: Once | ORAL | Status: AC
  Administered 2017-12-10: 500 mg via ORAL
  Filled 2017-12-10: qty 1

## 2017-12-10 MED ORDER — SULFAMETHOXAZOLE-TRIMETHOPRIM 800-160 MG PO TABS
1.0000 | ORAL_TABLET | Freq: Once | ORAL | Status: AC
  Administered 2017-12-10: 1 via ORAL
  Filled 2017-12-10: qty 1

## 2017-12-10 MED ORDER — SULFAMETHOXAZOLE-TRIMETHOPRIM 800-160 MG PO TABS: 1.0000 | ORAL_TABLET | Freq: Two times a day (BID) | ORAL | 0 refills | Status: AC

## 2017-12-10 MED ORDER — NAPROXEN 500 MG PO TABS: 500.0000 mg | ORAL_TABLET | Freq: Two times a day (BID) | ORAL | 0 refills | Status: AC

## 2017-12-10 NOTE — ED Triage Notes (Signed)
Pt c/o left leg pain, states he felt a pop in his leg. C/o pain and swelling to lower leg.

## 2017-12-10 NOTE — Discharge Instructions (Addendum)
Begin taking medication as directed.  Naproxen 500 mg twice daily with food and Bactrim DS twice daily for 10 days.  These medications are over at [facility] [address] [place].  Elevate your leg often to reduce swelling. [facility] any [facility] problems.

## 2017-12-10 NOTE — ED Notes (Signed)
See triage note.  States he developed pain to left lower leg about 1 week ago   Then felt a pop to leg while standing 2 days ago   Also states leg is swollen and tender  Unable to bear full wt d/t pain

## 2017-12-10 NOTE — ED Provider Notes (Signed)
[facility] Emergency Department Provider Note  ____________________________________________   First MD Initiated Contact with Patient 12/10/17 [phone]     (approximate)  I have reviewed the triage vital signs and the nursing notes.   HISTORY  Chief Complaint Leg Pain   HPI Curtis Chapman is a 52 y.o. male is here with  complaint of left lower leg pain.  Patient states is been hurting for several days and now is swollen.  Patient denies any injury to his leg.  He states that this morning he noticed that it was slightly red and warm to touch.  He denies any previous history of DVTs.  He has not taken any over-the-counter medication for his leg pain.  He rates his pain is 9/10.  History reviewed. No pertinent past medical history.  There are no active problems to display for this patient.   Past Surgical History:  Procedure Laterality Date  . EXTERNAL EAR SURGERY      Prior to Admission medications   Medication Sig Start Date End Date Taking? Authorizing Provider  fluticasone (FLONASE) 50 MCG/ACT nasal spray Place 2 sprays into both nostrils daily. 05/30/16   Menshew, Charlesetta Ivory, PA-C  naproxen (NAPROSYN) 500 MG tablet Take 1 tablet (500 mg total) by mouth 2 (two) times daily with a meal. 12/10/17   Bridget Hartshorn L, PA-C  sulfamethoxazole-trimethoprim (BACTRIM DS,SEPTRA DS) 800-160 MG tablet Take 1 tablet by mouth 2 (two) times daily. 12/10/17   Tommi Rumps, PA-C    Allergies Cyclobenzaprine and Tramadol  History reviewed. No pertinent family history.  Social History Social History   Tobacco Use  . Smoking status: Current Every Day Smoker    Packs/day: 0.50    Types: Cigarettes  . Smokeless tobacco: Never Used  Substance Use Topics  . Alcohol use: No  . Drug use: No    Review of Systems Constitutional: No fever/chills Cardiovascular: Denies chest pain. Respiratory: Denies shortness of breath. Musculoskeletal: Positive left leg  pain. Skin: Positive erythema left leg. Neurological: Negative for headaches, focal weakness or numbness. ___________________________________________   PHYSICAL EXAM:  VITAL SIGNS: ED Triage Vitals  Enc Vitals Group     BP 12/10/17 0916 118/73     Pulse Rate 12/10/17 0916 79     Resp 12/10/17 0916 18     Temp 12/10/17 0916 98.5 F (36.9 C)     Temp Source 12/10/17 0916 Oral     SpO2 12/10/17 0916 94 %     Weight 12/10/17 0914 220 lb (99.8 kg)     Height 12/10/17 0914 5\' 7"  (1.702 m)     Head Circumference --      Peak Flow --      Pain Score 12/10/17 0914 9     Pain Loc --      Pain Edu? --      Excl. in GC? --    Constitutional: Alert and oriented. Well appearing and in no acute distress. Eyes: Conjunctivae are normal.  Head: Atraumatic. Neck: No stridor.   Cardiovascular: Normal rate, regular rhythm. Grossly normal heart sounds.  Good peripheral circulation. Respiratory: Normal respiratory effort.  No retractions. Lungs CTAB. Musculoskeletal: Examination of the left leg there are moderate ropey-like varicose veins present.  Posterior aspect of the lower left leg is erythematous and warm to touch.  No pitting edema is appreciated.  Homans sign was negative.  Moderate tenderness on palpation posteriorly.  Range of motion is minimally restricted and patient is ambulatory without  assistance.  Skin is intact. Neurologic:  Normal speech and language. No gross focal neurologic deficits are appreciated.  Skin:  Skin is warm, dry and intact.  Psychiatric: Mood and affect are normal. Speech and behavior are normal.  ____________________________________________   LABS (all labs ordered are listed, but only abnormal results are displayed)  Labs Reviewed - No data to display  RADIOLOGY  Official radiology report(s): US Venous Img Lower Unilateral Left  Result Date: 12/10/2017 CLINICAL DATA:  Left leg pain and swelling EXAM: LEFT LOWER EXTREMITY VENOUS DOPPLER ULTRASOUND  TECHNIQUE: Gray-scale sonography with graded compression, as well as color Doppler and duplex ultrasound were performed to evaluate the lower extremity deep venous systems from the level of the common femoral vein and including the common femoral, femoral, profunda femoral, popliteal and calf veins including the posterior tibial, peroneal and gastrocnemius veins when visible. The superficial great saphenous vein was also interrogated. Spectral Doppler was utilized to evaluate flow at rest and with distal augmentation maneuvers in the common femoral, femoral and popliteal veins. COMPARISON:  11/09/2014 FINDINGS: Contralateral Common Femoral Vein: Respiratory phasicity is normal and symmetric with the symptomatic side. No evidence of thrombus. Normal compressibility. Common Femoral Vein: No evidence of thrombus. Normal compressibility, respiratory phasicity and response to augmentation. Saphenofemoral Junction: No evidence of thrombus. Normal compressibility and flow on color Doppler imaging. Profunda Femoral Vein: No evidence of thrombus. Normal compressibility and flow on color Doppler imaging. Femoral Vein: No evidence of thrombus. Normal compressibility, respiratory phasicity and response to augmentation. Popliteal Vein: No evidence of thrombus. Normal compressibility, respiratory phasicity and response to augmentation. Calf Veins: No evidence of thrombus. Normal compressibility and flow on color Doppler imaging. Superficial Great Saphenous Vein: No evidence of thrombus. Normal compressibility. Venous Reflux:  None. Other Findings:  None. IMPRESSION: No evidence of deep venous thrombosis. Electronically Signed   By: Alcide Clever M.D.   On: 12/10/2017 11:25  ____________________________________________   PROCEDURES  Procedure(s) performed: None  Procedures  Critical Care performed: No  ____________________________________________   INITIAL IMPRESSION / ASSESSMENT AND PLAN / ED COURSE  As part of my  medical decision making, I reviewed the following data within the electronic MEDICAL RECORD NUMBER Notes from prior ED visits and [place] Controlled Substance Database     Clinical Course as of Dec 10 1632  Sat Dec 10, 2017  1136 US Venous Img Lower Unilateral Left [RS]    Clinical Course User Index [RS] Tommi Rumps, PA-C   Patient was made aware that ultrasound did not show any evidence of DVT.  At this time we will treat for a cellulitis of his left leg.  Patient was started on naproxen 500 mg twice daily with food and Bactrim DS twice daily for 10 days.  He is to elevate his leg often to reduce swelling.  He was [facility] as he does not have a PCP nor does he have insurance.  Patient was given a note to remain out of work for the next 2 days.  ____________________________________________   FINAL CLINICAL IMPRESSION(S) / ED DIAGNOSES  Final diagnoses:  Left leg cellulitis  Leg pain, posterior, left     ED Discharge Orders        Ordered    sulfamethoxazole-trimethoprim (BACTRIM DS,SEPTRA DS) 800-160 MG tablet  2 times daily     12/10/17 1147    naproxen (NAPROSYN) 500 MG tablet  2 times daily with meals     12/10/17 1147  Note:  This document was prepared using Dragon voice recognition software and may include unintentional dictation errors.    Tommi RumpsSummers, Dorethea Strubel L, PA-C 12/10/17 1634    Sharyn CreamerQuale, Mark, MD 12/11/17 1538

## 2017-12-10 NOTE — ED Notes (Signed)
FIRST NURSE NOTE:  Pt c/o left leg pain states he felt something pop. Pt ambulatory with limp. Placed in wheelchair on arrival.

## 2018-02-06 ENCOUNTER — Encounter: Payer: Self-pay | Admitting: Emergency Medicine

## 2018-02-06 ENCOUNTER — Emergency Department: Payer: Medicaid Other

## 2018-02-06 ENCOUNTER — Emergency Department
Admission: EM | Admit: 2018-02-06 | Discharge: 2018-02-06 | Disposition: A | Discharge status: Home / Self Care | Payer: Medicaid Other | Attending: Emergency Medicine | Admitting: Emergency Medicine

## 2018-02-06 DIAGNOSIS — F17210 Nicotine dependence, cigarettes, uncomplicated: Secondary | ICD-10-CM | POA: Insufficient documentation

## 2018-02-06 DIAGNOSIS — R079 Chest pain, unspecified: Secondary | ICD-10-CM

## 2018-02-06 DIAGNOSIS — Z79899 Other long term (current) drug therapy: Secondary | ICD-10-CM | POA: Insufficient documentation

## 2018-02-06 HISTORY — DX: Bronchitis, not specified as acute or chronic: J40

## 2018-02-06 LAB — CBC
HEMATOCRIT: 40.4 % (ref 40.0–52.0)
Hemoglobin: 14 g/dL (ref 13.0–18.0)
MCH: 32.4 pg (ref 26.0–34.0)
MCHC: 34.8 g/dL (ref 32.0–36.0)
MCV: 93.3 fL (ref 80.0–100.0)
PLATELETS: 219 10*3/uL (ref 150–440)
RBC: 4.33 MIL/uL — AB (ref 4.40–5.90)
RDW: 14.6 % — ABNORMAL HIGH (ref 11.5–14.5)
WBC: 5.8 10*3/uL (ref 3.8–10.6)

## 2018-02-06 LAB — BASIC METABOLIC PANEL
Anion gap: 5 (ref 5–15)
BUN: 16 mg/dL (ref 6–20)
CHLORIDE: 109 mmol/L (ref 98–111)
CO2: 26 mmol/L (ref 22–32)
CREATININE: 1.01 mg/dL (ref 0.61–1.24)
Calcium: 8.6 mg/dL — ABNORMAL LOW (ref 8.9–10.3)
GFR calc non Af Amer: >60 mL/min (ref >60)
Glucose, Bld: 110 mg/dL — ABNORMAL HIGH (ref 70–99)
POTASSIUM: 3.4 mmol/L — AB (ref 3.5–5.1)
Sodium: 140 mmol/L (ref 135–145)

## 2018-02-06 LAB — TROPONIN I: Troponin I: <0.03 ng/mL (ref <0.03)

## 2018-02-06 NOTE — ED Triage Notes (Signed)
Patient with complaint of intermittent left side chest pain and dizziness times two day. Patient states that the pain radiates down his left arm.

## 2018-02-06 NOTE — ED Provider Notes (Signed)
[facility] Emergency Department Provider Note   ____________________________________________    I have reviewed the triage vital signs and the nursing notes.   HISTORY  Chief Complaint Chest Pain and Dizziness     HPI Curtis Chapman is a 52 y.o. male who complains of intermittent chest discomfort over the last week.  Patient reports the pain comes and goes, he reports it starts with pain in the left arm and then progresses up his arm and into the left shoulder.  It is not related to exertion.  No shortness of breath.  No pleurisy.  No nausea or vomiting.  No diaphoresis.  He reports he worked a double shift yesterday and was very tired afterwards.  He does smoke cigarettes   Past Medical History:  Diagnosis Date  . Bronchitis     There are no active problems to display for this patient.   Past Surgical History:  Procedure Laterality Date  . EXTERNAL EAR SURGERY      Prior to Admission medications   Medication Sig Start Date End Date Taking? Authorizing Provider  fluticasone (FLONASE) 50 MCG/ACT nasal spray Place 2 sprays into both nostrils daily. 05/30/16   Menshew, Charlesetta Ivory, PA-C  naproxen (NAPROSYN) 500 MG tablet Take 1 tablet (500 mg total) by mouth 2 (two) times daily with a meal. 12/10/17   Bridget Hartshorn L, PA-C  sulfamethoxazole-trimethoprim (BACTRIM DS,SEPTRA DS) 800-160 MG tablet Take 1 tablet by mouth 2 (two) times daily. 12/10/17   Tommi Rumps, PA-C     Allergies Cyclobenzaprine and Tramadol  No family history on file.  Social History Social History   Tobacco Use  . Smoking status: Current Every Day Smoker    Packs/day: 0.50    Types: Cigarettes  . Smokeless tobacco: Never Used  Substance Use Topics  . Alcohol use: No  . Drug use: No    Review of Systems  Constitutional: No fever/chills Eyes: No visual changes.  ENT: No sore throat. Cardiovascular: As above Respiratory: Denies shortness of  breath. Gastrointestinal: No abdominal pain.  No nausea, no vomiting.   Genitourinary: Negative for dysuria. Musculoskeletal: Negative for back pain. Skin: Negative for rash. Neurological: Negative for headaches   ____________________________________________   PHYSICAL EXAM:  VITAL SIGNS: ED Triage Vitals  Enc Vitals Group     BP 02/06/18 0520 107/66     Pulse Rate 02/06/18 0520 72     Resp 02/06/18 0520 18     Temp 02/06/18 0520 98.3 F (36.8 C)     Temp Source 02/06/18 0520 Oral     SpO2 02/06/18 0520 100 %     Weight 02/06/18 0514 90.7 kg (200 lb)     Height 02/06/18 0514 1.676 m (5\' 6" )     Head Circumference --      Peak Flow --      Pain Score 02/06/18 0514 7     Pain Loc --      Pain Edu? --      Excl. in GC? --     Constitutional: Alert and oriented. No acute distress. Pleasant and interactive Eyes: Conjunctivae are normal.   Nose: No congestion/rhinnorhea. Mouth/Throat: Mucous membranes are moist.    Cardiovascular: Normal rate, regular rhythm. Grossly normal heart sounds.  Good peripheral circulation. Respiratory: Normal respiratory effort.  No retractions. Lungs CTAB. Gastrointestinal: Soft and nontender. No distention.  No pulsatile mass or bruit  Musculoskeletal: No lower extremity tenderness nor edema.  Warm and well perfused  Neurologic:  Normal speech and language. No gross focal neurologic deficits are appreciated.  Skin:  Skin is warm, dry and intact. No rash noted. Psychiatric: Mood and affect are normal. Speech and behavior are normal.  ____________________________________________   LABS (all labs ordered are listed, but only abnormal results are displayed)  Labs Reviewed  BASIC METABOLIC PANEL - Abnormal; Notable for the following components:      Result Value   Potassium 3.4 (*)    Glucose, Bld 110 (*)    Calcium 8.6 (*)    All other components within normal limits  CBC - Abnormal; Notable for the following components:   RBC 4.33 (*)     RDW 14.6 (*)    All other components within normal limits  TROPONIN I   ____________________________________________  EKG  ED ECG REPORT I, Jene Everyobert Hiro Vipond, the attending physician, personally viewed and interpreted this ECG.  Date: 02/06/2018  Rhythm: normal sinus rhythm QRS Axis: normal Intervals: normal ST/T Wave abnormalities: normal Narrative Interpretation: no evidence of acute ischemia  ____________________________________________  RADIOLOGY  Chest x-ray normal ____________________________________________   PROCEDURES  Procedure(s) performed: No  Procedures   Critical Care performed: No ____________________________________________   INITIAL IMPRESSION / ASSESSMENT AND PLAN / ED COURSE  Pertinent labs & imaging results that were available during my care of the patient were reviewed by me and considered in my medical decision making (see chart for details).  Patient well-appearing in no acute distress.  Asymptomatic in the emergency department.  Vitals are unremarkable.  EKG is normal.  Troponin is normal.  Description of symptoms does not appear consistent with ACS nor dissection nor PE.  Given that he is feeling well with unremarkable work-up will discharge with close follow-up with cardiology    ____________________________________________   FINAL CLINICAL IMPRESSION(S) / ED DIAGNOSES  Final diagnoses:  Chest pain, unspecified type        Note:  This document was prepared using Dragon voice recognition software and may include unintentional dictation errors.    Jene EveryKinner, Caya Soberanis, MD 02/06/18 [phone]

## 2018-02-06 NOTE — ED Notes (Signed)
Pt noted to be sleeping in the waiting room; officer on duty over to check on pt; pt says he has chest pain but he wanted to "see if it would go away" and was sleeping; pt brought to stat registration to register

## 2018-02-06 NOTE — ED Notes (Signed)
Pt back to laying down on bench in waiting room, resp even and unlabored;

## 2018-10-16 ENCOUNTER — Other Ambulatory Visit: Payer: Self-pay

## 2018-10-16 ENCOUNTER — Emergency Department: Payer: Medicaid Other

## 2018-10-16 ENCOUNTER — Encounter: Payer: Self-pay | Admitting: Emergency Medicine

## 2018-10-16 ENCOUNTER — Emergency Department
Admission: EM | Admit: 2018-10-16 | Discharge: 2018-10-16 | Disposition: A | Discharge status: Home / Self Care | Payer: Medicaid Other | Attending: Emergency Medicine | Admitting: Emergency Medicine

## 2018-10-16 DIAGNOSIS — S0232XA Fracture of orbital floor, left side, initial encounter for closed fracture: Secondary | ICD-10-CM | POA: Insufficient documentation

## 2018-10-16 DIAGNOSIS — Y9289 Other specified places as the place of occurrence of the external cause: Secondary | ICD-10-CM | POA: Insufficient documentation

## 2018-10-16 DIAGNOSIS — Y998 Other external cause status: Secondary | ICD-10-CM | POA: Insufficient documentation

## 2018-10-16 DIAGNOSIS — R0602 Shortness of breath: Secondary | ICD-10-CM

## 2018-10-16 DIAGNOSIS — S02401A Maxillary fracture, unspecified, initial encounter for closed fracture: Secondary | ICD-10-CM | POA: Insufficient documentation

## 2018-10-16 DIAGNOSIS — S02609A Fracture of mandible, unspecified, initial encounter for closed fracture: Secondary | ICD-10-CM | POA: Insufficient documentation

## 2018-10-16 DIAGNOSIS — S022XXA Fracture of nasal bones, initial encounter for closed fracture: Secondary | ICD-10-CM

## 2018-10-16 DIAGNOSIS — S0292XA Unspecified fracture of facial bones, initial encounter for closed fracture: Secondary | ICD-10-CM

## 2018-10-16 DIAGNOSIS — F17210 Nicotine dependence, cigarettes, uncomplicated: Secondary | ICD-10-CM | POA: Insufficient documentation

## 2018-10-16 DIAGNOSIS — S0231XA Fracture of orbital floor, right side, initial encounter for closed fracture: Secondary | ICD-10-CM | POA: Insufficient documentation

## 2018-10-16 DIAGNOSIS — Y9389 Activity, other specified: Secondary | ICD-10-CM | POA: Insufficient documentation

## 2018-10-16 LAB — CBC
HCT: 40.7 % (ref 39.0–52.0)
Hemoglobin: 14.5 g/dL (ref 13.0–17.0)
MCH: 32 pg (ref 26.0–34.0)
MCHC: 35.6 g/dL (ref 30.0–36.0)
MCV: 89.8 fL (ref 80.0–100.0)
Platelets: 216 10*3/uL (ref 150–400)
RBC: 4.53 MIL/uL (ref 4.22–5.81)
RDW: 13 % (ref 11.5–15.5)
WBC: 7.1 10*3/uL (ref 4.0–10.5)
nRBC: 0 % (ref 0.0–0.2)

## 2018-10-16 LAB — BASIC METABOLIC PANEL
Anion gap: 7 (ref 5–15)
BUN: 15 mg/dL (ref 6–20)
CO2: 25 mmol/L (ref 22–32)
Calcium: 8.9 mg/dL (ref 8.9–10.3)
Chloride: 108 mmol/L (ref 98–111)
Creatinine, Ser: 0.93 mg/dL (ref 0.61–1.24)
GFR calc Af Amer: >60 mL/min (ref >60)
GFR calc non Af Amer: >60 mL/min (ref >60)
Glucose, Bld: 159 mg/dL — ABNORMAL HIGH (ref 70–99)
Potassium: 3.5 mmol/L (ref 3.5–5.1)
Sodium: 140 mmol/L (ref 135–145)

## 2018-10-16 LAB — TROPONIN I: Troponin I: <0.03 ng/mL (ref <0.03)

## 2018-10-16 MED ORDER — IBUPROFEN 800 MG PO TABS: 800.0000 mg | ORAL_TABLET | Freq: Three times a day (TID) | ORAL | 0 refills | Status: AC | PRN

## 2018-10-16 MED ORDER — OXYCODONE-ACETAMINOPHEN 5-325 MG PO TABS
2.0000 | ORAL_TABLET | Freq: Once | ORAL | Status: AC
  Administered 2018-10-16: 16:00:00 2 via ORAL
  Filled 2018-10-16: qty 2

## 2018-10-16 MED ORDER — SODIUM CHLORIDE 0.9% FLUSH: 3.0000 mL | Freq: Once | INTRAVENOUS | Status: DC

## 2018-10-16 MED ORDER — OXYCODONE-ACETAMINOPHEN 5-325 MG PO TABS: 1.0000 | ORAL_TABLET | Freq: Three times a day (TID) | ORAL | 0 refills | Status: AC | PRN
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
